# Patient Record
Sex: Female | Born: 1980
Health system: Southern US, Community
[De-identification: ages and names within clinical notes are randomized; demographics above are authoritative.]

## PROBLEM LIST (undated history)

## (undated) DIAGNOSIS — T7840XA Allergy, unspecified, initial encounter: Secondary | ICD-10-CM

## (undated) HISTORY — PX: MYOMECTOMY: SHX85

## (undated) HISTORY — DX: Allergy, unspecified, initial encounter: T78.40XA

---

## 2003-08-17 ENCOUNTER — Emergency Department (HOSPITAL_COMMUNITY): Admission: EM | Admit: 2003-08-17 | Discharge: 2003-08-17 | Payer: Self-pay | Admitting: Emergency Medicine

## 2004-03-26 ENCOUNTER — Emergency Department (HOSPITAL_COMMUNITY): Admission: EM | Admit: 2004-03-26 | Discharge: 2004-03-26 | Payer: Self-pay | Admitting: Emergency Medicine

## 2005-07-27 ENCOUNTER — Ambulatory Visit (HOSPITAL_COMMUNITY): Admission: EM | Admit: 2005-07-27 | Discharge: 2005-07-27 | Payer: Self-pay | Admitting: Emergency Medicine

## 2008-11-04 ENCOUNTER — Encounter: Admission: RE | Admit: 2008-11-04 | Discharge: 2008-11-04 | Payer: Self-pay | Admitting: Family Medicine

## 2011-03-03 NOTE — Op Note (Signed)
NAME:  Hannah Graham, Hannah Graham NO.:  [id number]   MEDICAL RECORD NO.:  [id number]          PATIENT TYPE:  EMS   LOCATION:  ED                           FACILITY:  [facility]   PHYSICIAN:  Dyke Brackett, M.D.    DATE OF BIRTH:  1981-07-21   DATE OF PROCEDURE:  DATE OF DISCHARGE:                                 OPERATIVE REPORT   PREOPERATIVE DIAGNOSIS:  Foreign body impaction, left foot.   POSTOPERATIVE DIAGNOSIS:  Foreign body impaction, left foot.   OPERATION:  1.  Removal of foreign body.  2.  Incision and drainage, left foot.   SURGEON:  Dyke Brackett, M.D.   ASSISTANT:  Lolita Cram, P.A.   DESCRIPTION OF PROCEDURE:  The patient actually had a wine stem imbedded  through her tennis shoe tightly into her foot.  Emergency room evaluation,  which was certainly appropriate.  This was something not to be done over in  the ER due to the fact that it was tightly in the foot as well as the fact  that this would require exploration to make sure there is no residual  foreign body once the foreign object was removed.  After general anesthesia,  there was actually a sharp edge of a wine stem embedded into the patient's  mid foot.  With an unscrewing-type mechanism, this was removed.  It appeared  to be most of the problem relative to the tension on it was through the  rubber of the tennis shoe.  This __________ after this was removed, and  there was no apparent breakage of the stem of the wine glass.  There was no  evidence of any other glass foreign body, and the glass was obvious on the  preoperative x-rays.  The incision was enlarged, probably distally and  proximally, of about 3-4 cm.  There was some mild damage to the intrinsic  musculature to the foot, but there appear to be no neurologic or nerve  injury.  No vascular injury.  Careful probing was noted.  There was no  residual foreign body noted, and it was irrigated out with 3000 cc of  pulsatile lavage.  The incisional  part of the surgery was closed, but a  drain was placed in the small defect created by the wine stem.  A lightly  compressive bulky sterile dressing applied to the foot.  Marcaine without  epinephrine infiltrated into the skin.  Taken to the recovery room in stable  condition.  The tourniquet was released after application of the dressing.  It was not exsanguinated.  Tourniquet was approximately 35 minutes.      Dyke Brackett, M.D.  Electronically Signed     WDC/MEDQ  D:  07/27/2005  T:  07/27/2005  Job:  [id number]

## 2012-01-02 ENCOUNTER — Ambulatory Visit: Payer: Managed Care, Other (non HMO)

## 2012-01-02 ENCOUNTER — Ambulatory Visit (INDEPENDENT_AMBULATORY_CARE_PROVIDER_SITE_OTHER): Payer: Managed Care, Other (non HMO) | Admitting: Family Medicine

## 2012-01-02 VITALS — BP 116/74 | HR 73 | Temp 97.8°F | Resp 16 | Ht 63.0 in | Wt 257.0 lb

## 2012-01-02 DIAGNOSIS — M25539 Pain in unspecified wrist: Secondary | ICD-10-CM

## 2012-01-02 DIAGNOSIS — M778 Other enthesopathies, not elsewhere classified: Secondary | ICD-10-CM

## 2012-01-02 DIAGNOSIS — M25532 Pain in left wrist: Secondary | ICD-10-CM

## 2012-01-02 DIAGNOSIS — M7989 Other specified soft tissue disorders: Secondary | ICD-10-CM

## 2012-01-02 MED ORDER — MELOXICAM 7.5 MG PO TABS: 7.5000 mg | ORAL_TABLET | Freq: Every day | ORAL | Status: AC

## 2012-01-02 NOTE — Patient Instructions (Signed)
Recheck in next 7-10 days.  Elevate arm tonight, return tomorrow if swelling not improving.  Wrist brace as needed and mobic once per day as needed.

## 2012-01-02 NOTE — Progress Notes (Signed)
  Subjective:    Patient ID: Hannah Graham, female    DOB: 08/07/1981, 31 y.o.   MRN: [id number]  HPI Episodic L wrist pain for years, rx brace few years ago.  Usually uses brace every few months - improves usually in a few days.   Current symptoms since yesterday, NKI felt swollen o/n. Doesn't have brace now.  Sore on top of wrist and moving back into arm today and hand feels swollen.    Tx: none R hand dominant. [employer] - computer work  - no change in activity or work.  Review of Systems  Constitutional: Negative for fever and chills.  Musculoskeletal: Positive for joint swelling and arthralgias.  Skin: Negative for rash.       Objective:   Physical Exam  Constitutional: She is oriented to person, place, and time. She appears well-developed and well-nourished.  HENT:  Head: Normocephalic and atraumatic.  Pulmonary/Chest: Effort normal.  Musculoskeletal:       Left wrist: She exhibits decreased range of motion, tenderness and swelling. She exhibits no bony tenderness, no deformity and no laceration.       Arms: Neurological: She is alert and oriented to person, place, and time.  Skin: Skin is warm and dry. No rash noted. No erythema.       Cap refill less than 1 second into L hand and fingertips warm.  No rash.  Psychiatric: She has a normal mood and affect. Her behavior is normal.     [facility] reading (PRIMARY) by  Dr. Neva Seat L wrist: NAD.Marland Kitchen       Assessment & Plan:  Hannah Graham is a 31 y.o. female 1. Pain in left wrist  DG Wrist Complete Left  2. Swelling of hand    3. Wrist tendonitis      Suspected dorsal wrist tendonitis, as NKI, and similar sx's in past except swelling.  Neurovasc intact distally.  Trial of relative rest, wrist splint except ROM BID, and mobic 7.5mg  qd prn.  Elevate tonight - is swelling not improved tomorrow, can rtc for recheck, or to ER sooner if any worsening.  Otherwise recheck next 10 days. Note for work - next 3 days to decrease  use L hand and can wear brace.

## 2012-05-10 ENCOUNTER — Ambulatory Visit (INDEPENDENT_AMBULATORY_CARE_PROVIDER_SITE_OTHER): Payer: Managed Care, Other (non HMO) | Admitting: Internal Medicine

## 2012-05-10 ENCOUNTER — Ambulatory Visit: Payer: Managed Care, Other (non HMO)

## 2012-05-10 VITALS — BP 124/82 | HR 79 | Temp 98.3°F | Resp 16 | Ht 63.5 in | Wt 267.0 lb

## 2012-05-10 DIAGNOSIS — S66912A Strain of unspecified muscle, fascia and tendon at wrist and hand level, left hand, initial encounter: Secondary | ICD-10-CM

## 2012-05-10 DIAGNOSIS — E669 Obesity, unspecified: Secondary | ICD-10-CM

## 2012-05-10 DIAGNOSIS — L259 Unspecified contact dermatitis, unspecified cause: Secondary | ICD-10-CM

## 2012-05-10 DIAGNOSIS — S63509A Unspecified sprain of unspecified wrist, initial encounter: Secondary | ICD-10-CM

## 2012-05-10 DIAGNOSIS — L299 Pruritus, unspecified: Secondary | ICD-10-CM

## 2012-05-10 LAB — POCT GLYCOSYLATED HEMOGLOBIN (HGB A1C): Hemoglobin A1C: 5.2

## 2012-05-10 LAB — GLUCOSE, POCT (MANUAL RESULT ENTRY): POC Glucose: 112 mg/dl — AB (ref 70–99)

## 2012-05-10 MED ORDER — CLOBETASOL PROPIONATE 0.05 % EX CREA: TOPICAL_CREAM | Freq: Two times a day (BID) | CUTANEOUS | Status: AC

## 2012-05-10 MED ORDER — PREDNISONE 10 MG PO TABS: ORAL_TABLET | ORAL | Status: DC

## 2012-05-10 NOTE — Progress Notes (Signed)
  Subjective:    Patient ID: Hannah Graham, female    DOB: 10-14-1981, 31 y.o.   MRN: [id number]  HPI Has itchy faint rash neck upper back, upper inner arms,and chest for 1 week. Minimal rash seen, mostly itchy Also wrestling with boyfriend and wrist was twisted and popped, now painful to extend, a little swollen.   Review of Systems     Objective:   Physical Exam Wrist nmv intact, has painful extention Skin faint rash but mostly scratches seen Obesity, will ck glucose  [facility] reading (PRIMARY) by  Dr.guest lucency seen at navicular, probably normal  Results for orders placed in visit on 05/10/12  GLUCOSE, POCT (MANUAL RESULT ENTRY)      Component Value Range   POC Glucose 112 (*) 70 - 99 mg/dl  POCT GLYCOSYLATED HEMOGLOBIN (HGB A1C)      Component Value Range   Hemoglobin A1C 5.2          Assessment & Plan:  Contact dermatitis Wrist strain  RICE and Splint Prednisone and clobetasol cream and zyrtec

## 2012-09-11 ENCOUNTER — Other Ambulatory Visit (HOSPITAL_COMMUNITY): Payer: Self-pay | Admitting: Obstetrics

## 2012-09-11 DIAGNOSIS — R102 Pelvic and perineal pain: Secondary | ICD-10-CM

## 2012-09-17 ENCOUNTER — Ambulatory Visit (HOSPITAL_COMMUNITY)
Admission: RE | Admit: 2012-09-17 | Discharge: 2012-09-17 | Disposition: A | Discharge status: Home / Self Care | Payer: Managed Care, Other (non HMO) | Source: Ambulatory Visit | Attending: Obstetrics | Admitting: Obstetrics

## 2012-09-17 DIAGNOSIS — N83209 Unspecified ovarian cyst, unspecified side: Secondary | ICD-10-CM | POA: Insufficient documentation

## 2012-09-17 DIAGNOSIS — N949 Unspecified condition associated with female genital organs and menstrual cycle: Secondary | ICD-10-CM | POA: Insufficient documentation

## 2012-09-17 DIAGNOSIS — R102 Pelvic and perineal pain: Secondary | ICD-10-CM

## 2012-09-20 ENCOUNTER — Other Ambulatory Visit (HOSPITAL_COMMUNITY): Payer: Self-pay | Admitting: Obstetrics

## 2012-09-20 DIAGNOSIS — Q524 Other congenital malformations of vagina: Secondary | ICD-10-CM

## 2012-09-20 DIAGNOSIS — Z09 Encounter for follow-up examination after completed treatment for conditions other than malignant neoplasm: Secondary | ICD-10-CM

## 2012-09-24 ENCOUNTER — Ambulatory Visit (HOSPITAL_COMMUNITY)
Admission: RE | Admit: 2012-09-24 | Discharge: 2012-09-24 | Disposition: A | Discharge status: Home / Self Care | Payer: Managed Care, Other (non HMO) | Source: Ambulatory Visit | Attending: Obstetrics | Admitting: Obstetrics

## 2012-09-24 ENCOUNTER — Other Ambulatory Visit (HOSPITAL_COMMUNITY): Payer: Self-pay | Admitting: Obstetrics

## 2012-09-24 DIAGNOSIS — Z09 Encounter for follow-up examination after completed treatment for conditions other than malignant neoplasm: Secondary | ICD-10-CM

## 2012-09-24 DIAGNOSIS — Q524 Other congenital malformations of vagina: Secondary | ICD-10-CM

## 2012-09-24 DIAGNOSIS — Q528 Other specified congenital malformations of female genitalia: Secondary | ICD-10-CM | POA: Insufficient documentation

## 2012-09-24 DIAGNOSIS — N949 Unspecified condition associated with female genital organs and menstrual cycle: Secondary | ICD-10-CM | POA: Insufficient documentation

## 2012-09-24 DIAGNOSIS — N83209 Unspecified ovarian cyst, unspecified side: Secondary | ICD-10-CM | POA: Insufficient documentation

## 2012-09-24 DIAGNOSIS — Q516 Embryonic cyst of cervix: Secondary | ICD-10-CM | POA: Insufficient documentation

## 2012-09-24 DIAGNOSIS — N898 Other specified noninflammatory disorders of vagina: Secondary | ICD-10-CM | POA: Insufficient documentation

## 2013-03-12 ENCOUNTER — Other Ambulatory Visit (HOSPITAL_COMMUNITY): Payer: Self-pay | Admitting: Obstetrics

## 2013-03-12 DIAGNOSIS — N83209 Unspecified ovarian cyst, unspecified side: Secondary | ICD-10-CM

## 2013-03-21 ENCOUNTER — Ambulatory Visit (HOSPITAL_COMMUNITY)
Admission: RE | Admit: 2013-03-21 | Discharge: 2013-03-21 | Disposition: A | Discharge status: Home / Self Care | Payer: Managed Care, Other (non HMO) | Source: Ambulatory Visit | Attending: Obstetrics | Admitting: Obstetrics

## 2013-03-21 DIAGNOSIS — N83209 Unspecified ovarian cyst, unspecified side: Secondary | ICD-10-CM

## 2013-03-28 ENCOUNTER — Ambulatory Visit (HOSPITAL_COMMUNITY): Admission: RE | Admit: 2013-03-28 | Payer: Managed Care, Other (non HMO) | Source: Ambulatory Visit

## 2013-04-04 ENCOUNTER — Ambulatory Visit (HOSPITAL_COMMUNITY)
Admission: RE | Admit: 2013-04-04 | Discharge: 2013-04-04 | Disposition: A | Discharge status: Home / Self Care | Payer: Managed Care, Other (non HMO) | Source: Ambulatory Visit | Attending: Obstetrics | Admitting: Obstetrics

## 2013-04-04 DIAGNOSIS — N898 Other specified noninflammatory disorders of vagina: Secondary | ICD-10-CM | POA: Insufficient documentation

## 2013-04-04 DIAGNOSIS — N854 Malposition of uterus: Secondary | ICD-10-CM | POA: Insufficient documentation

## 2013-04-04 DIAGNOSIS — D259 Leiomyoma of uterus, unspecified: Secondary | ICD-10-CM | POA: Insufficient documentation

## 2013-04-04 DIAGNOSIS — N83209 Unspecified ovarian cyst, unspecified side: Secondary | ICD-10-CM | POA: Insufficient documentation

## 2013-12-31 ENCOUNTER — Ambulatory Visit (INDEPENDENT_AMBULATORY_CARE_PROVIDER_SITE_OTHER): Payer: Managed Care, Other (non HMO) | Admitting: Family Medicine

## 2013-12-31 VITALS — BP 116/72 | HR 66 | Temp 98.8°F | Resp 16 | Ht 63.5 in | Wt 241.0 lb

## 2013-12-31 DIAGNOSIS — J029 Acute pharyngitis, unspecified: Secondary | ICD-10-CM

## 2013-12-31 DIAGNOSIS — Z113 Encounter for screening for infections with a predominantly sexual mode of transmission: Secondary | ICD-10-CM

## 2013-12-31 DIAGNOSIS — N898 Other specified noninflammatory disorders of vagina: Secondary | ICD-10-CM

## 2013-12-31 DIAGNOSIS — J329 Chronic sinusitis, unspecified: Secondary | ICD-10-CM

## 2013-12-31 DIAGNOSIS — R8290 Unspecified abnormal findings in urine: Secondary | ICD-10-CM

## 2013-12-31 DIAGNOSIS — R0982 Postnasal drip: Secondary | ICD-10-CM

## 2013-12-31 DIAGNOSIS — N760 Acute vaginitis: Secondary | ICD-10-CM

## 2013-12-31 DIAGNOSIS — R82998 Other abnormal findings in urine: Secondary | ICD-10-CM

## 2013-12-31 DIAGNOSIS — B9689 Other specified bacterial agents as the cause of diseases classified elsewhere: Secondary | ICD-10-CM

## 2013-12-31 LAB — POCT UA - MICROSCOPIC ONLY
Casts, Ur, LPF, POC: NEGATIVE
Crystals, Ur, HPF, POC: NEGATIVE
Mucus, UA: NEGATIVE
RBC, urine, microscopic: NEGATIVE
Yeast, UA: NEGATIVE

## 2013-12-31 LAB — POCT WET PREP WITH KOH
Clue Cells Wet Prep HPF POC: 100
KOH Prep POC: NEGATIVE
Trichomonas, UA: NEGATIVE
WBC Wet Prep HPF POC: NEGATIVE
Yeast Wet Prep HPF POC: NEGATIVE

## 2013-12-31 LAB — POCT URINALYSIS DIPSTICK
Bilirubin, UA: NEGATIVE
Glucose, UA: NEGATIVE
Ketones, UA: NEGATIVE
Nitrite, UA: NEGATIVE
Protein, UA: NEGATIVE
Spec Grav, UA: 1.025
Urobilinogen, UA: 1
pH, UA: 7

## 2013-12-31 LAB — POCT RAPID STREP A (OFFICE): Rapid Strep A Screen: NEGATIVE

## 2013-12-31 MED ORDER — FLUTICASONE PROPIONATE 50 MCG/ACT NA SUSP: 2.0000 | Freq: Every day | NASAL | Status: DC

## 2013-12-31 MED ORDER — FLUCONAZOLE 150 MG PO TABS: 150.0000 mg | ORAL_TABLET | Freq: Once | ORAL | Status: DC

## 2013-12-31 MED ORDER — METRONIDAZOLE 500 MG PO TABS: 500.0000 mg | ORAL_TABLET | Freq: Two times a day (BID) | ORAL | Status: DC

## 2013-12-31 NOTE — Progress Notes (Signed)
Chief Complaint:  Chief Complaint  Patient presents with  . Sore Throat    x 3 days  . Vaginal Discharge    x 2 weeks     HPI: Hannah Graham is a 33 y.o. female who is here:  1. Has a history of sinus issues, gets drainage and sore throat. Has tried hot tea with out relief. She has had laryngitis. She has had some relief with this since drinking tea. Denies fevers, chill, ear pain or facial pain. No sick contacts. No contact with kids.Has had HA, sometime her uvula swells and has worsening sore throat.   2. Vaginal discaorge several weeks, has taken otc monistat without relief. Sometimes she has BV. She is sexually active, no  Condoms regularly. She has some abd pain but similar to her fibroid. 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 + odor. + clear dc. No recent abx.   She has been taking a streoid pill for the last 1 year from dermatology for her alopecia treatment  History reviewed. No pertinent past medical history. History reviewed. No pertinent past surgical history. History   Social History  . Marital Status: Single    Spouse Name: N/A    Number of Children: N/A  . Years of Education: N/A   Social History Main Topics  . Smoking status: Never Smoker   . Smokeless tobacco: None  . Alcohol Use: None  . Drug Use: None  . Sexual Activity: None   Other Topics Concern  . None   Social History Narrative  . None   Family History  Problem Relation Age of Onset  . Cancer Father     prostate  . Diabetes Father    No Known Allergies Prior to Admission medications   Medication Sig Start Date End Date Taking? Authorizing Provider  predniSONE (DELTASONE) 10 MG tablet Take 6d taper, 60-50-40-30-20-10 po pc 05/10/12  Yes Orma Flaming, MD  terbinafine (LAMISIL) 250 MG tablet Take 250 mg by mouth daily.    Historical Provider, MD     ROS: The patient denies fevers, chills, night sweats, unintentional weight loss, chest pain, palpitations,  wheezing, dyspnea on exertion, nausea, vomiting, abdominal pain,, hematuria, melena, numbness, weakness, or tingling.   All other systems have been reviewed and were otherwise negative with the exception of those mentioned in the HPI and as above.    PHYSICAL EXAM: Filed Vitals:   12/31/13 0958  BP: 116/72  Pulse: 66  Temp: 98.8 F (37.1 C)  Resp: 16   Filed Vitals:   12/31/13 0958  Height: 5' 3.5" (1.613 m)  Weight: 241 lb (109.317 kg)   Body mass index is 42.02 kg/(m^2).  General: Alert, no acute distress HEENT:  Normocephalic, atraumatic, oropharynx patent. EOMI, PERRLA, uvula nl, no exudates, tm nl, no sinus tenderness Cardiovascular:  Regular rate and rhythm, no rubs murmurs or gallops.  No Carotid bruits, radial pulse intact. No pedal edema.  Respiratory: Clear to auscultation bilaterally.  No wheezes, rales, or rhonchi.  No cyanosis, no use of accessory musculature GI: No organomegaly, abdomen is soft and non-tender, positive bowel sounds.  No masses. Skin: No rashes. Neurologic: Facial musculature symmetric. Psychiatric: Patient is appropriate throughout our interaction. Lymphatic: No cervical lymphadenopathy Musculoskeletal: Gait intact. GU-clear dc, no masses, + odor, cervix nl   LABS: Results for orders placed in visit on 12/31/13  POCT UA - MICROSCOPIC ONLY      Result Value  Ref Range   WBC, Ur, HPF, POC 1-2     RBC, urine, microscopic neg     Bacteria, U Microscopic 3+     Mucus, UA neg     Epithelial cells, urine per micros 3-8     Crystals, Ur, HPF, POC neg     Casts, Ur, LPF, POC neg     Yeast, UA neg    POCT URINALYSIS DIPSTICK      Result Value Ref Range   Color, UA yellow     Clarity, UA sl cloudy     Glucose, UA neg     Bilirubin, UA neg     Ketones, UA neg     Spec Grav, UA 1.025     Blood, UA small     pH, UA 7.0     Protein, UA neg     Urobilinogen, UA 1.0     Nitrite, UA neg     Leukocytes, UA Trace    POCT WET PREP WITH KOH       Result Value Ref Range   Trichomonas, UA Negative     Clue Cells Wet Prep HPF POC 100%     Epithelial Wet Prep HPF POC 3-8     Yeast Wet Prep HPF POC neg     Bacteria Wet Prep HPF POC 3+     RBC Wet Prep HPF POC 3-5     WBC Wet Prep HPF POC neg     KOH Prep POC Negative    POCT RAPID STREP A (OFFICE)      Result Value Ref Range   Rapid Strep A Screen Negative  Negative     EKG/XRAY:   Primary read interpreted by Dr. Marin Comment at [facility].   ASSESSMENT/PLAN: Encounter Diagnoses  Name Primary?  . Acute pharyngitis   . Screening for STD (sexually transmitted disease)   . Vaginal discharge   . Bacterial vaginosis Yes  . Post-nasal drainage   . Abnormal urinalysis    Flonase otc cepachol Rx flagyl and diflucan Labs pending for STD screening F/u prn  Gross sideeffects, risk and benefits, and alternatives of medications d/w patient. Patient is aware that all medications have potential sideeffects and we are unable to predict every sideeffect or drug-drug interaction that may occur.  Najiyah Paris, [place], DO 12/31/2013 12:28 PM

## 2013-12-31 NOTE — Patient Instructions (Signed)

## 2014-01-01 LAB — URINE CULTURE
Colony Count: NO GROWTH
Organism ID, Bacteria: NO GROWTH

## 2014-01-01 LAB — GC/CHLAMYDIA PROBE AMP
CT Probe RNA: NEGATIVE
GC Probe RNA: NEGATIVE

## 2015-06-16 IMAGING — US US PELVIS COMPLETE
1 series · 13 of 25 positions shown · non-contrast
Comparison: [DATE] and 09/24/2012

CLINICAL DATA: Follow-up ovarian cyst.  LMP 03/14/2013



[Series 1: us pelvis complete · 13 of 91 slices shown]
[im 1/91]
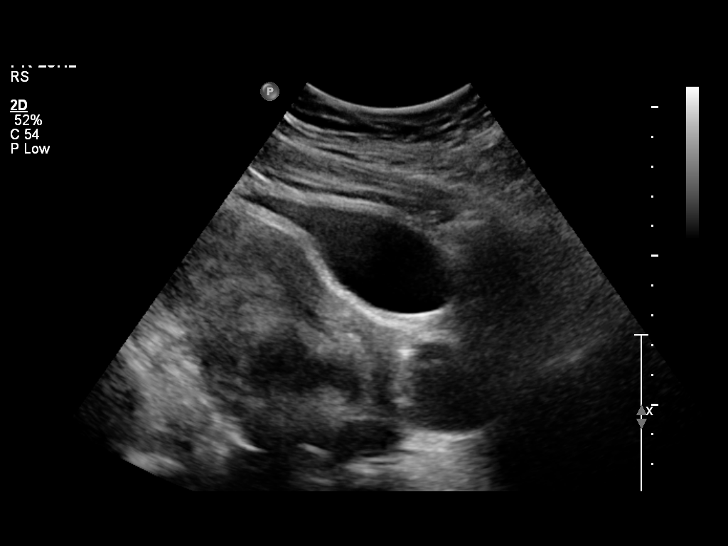
[im 8/91]
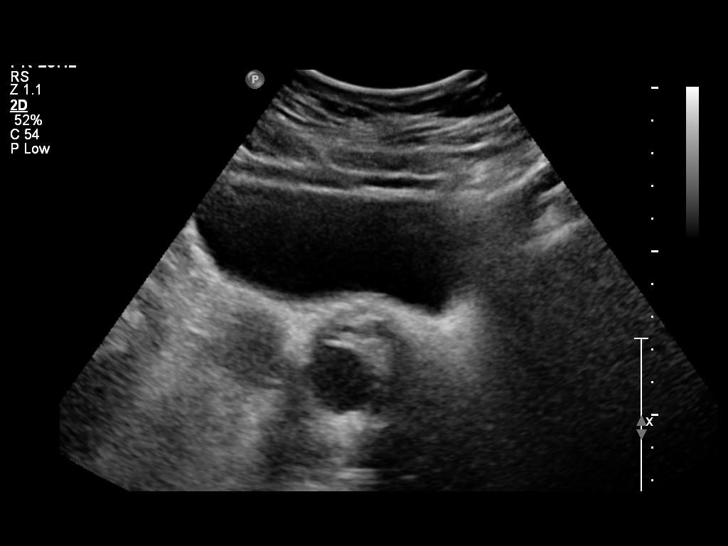
[im 16/91]
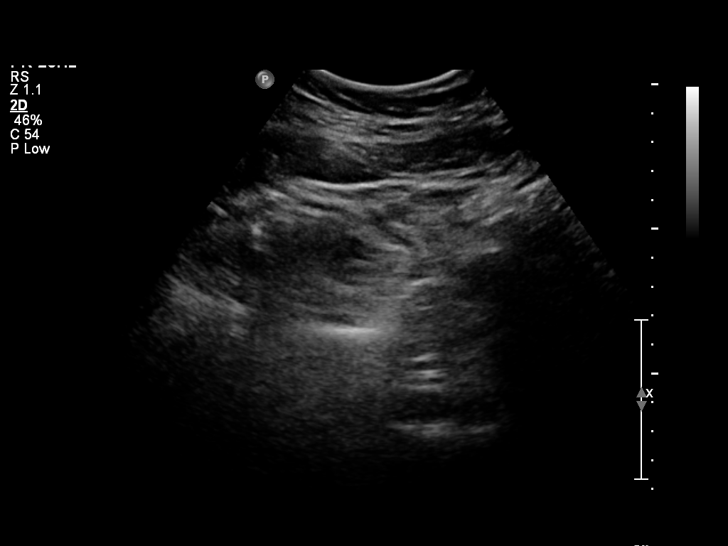
[im 23/91]
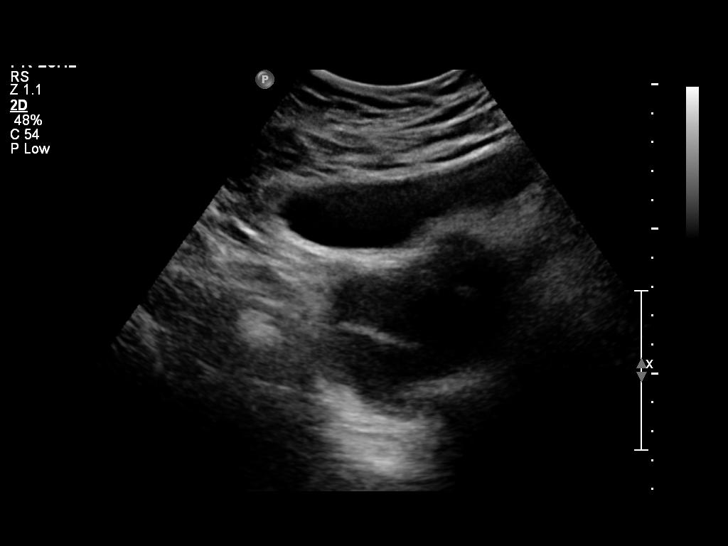
[im 31/91]
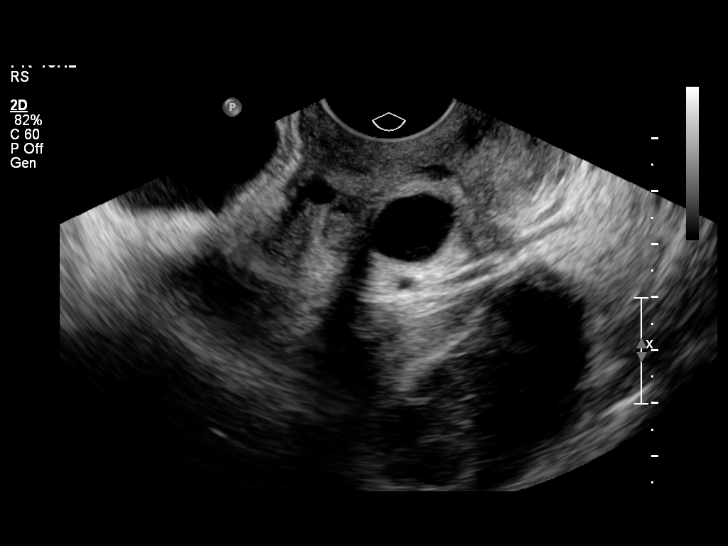
[im 38/91]
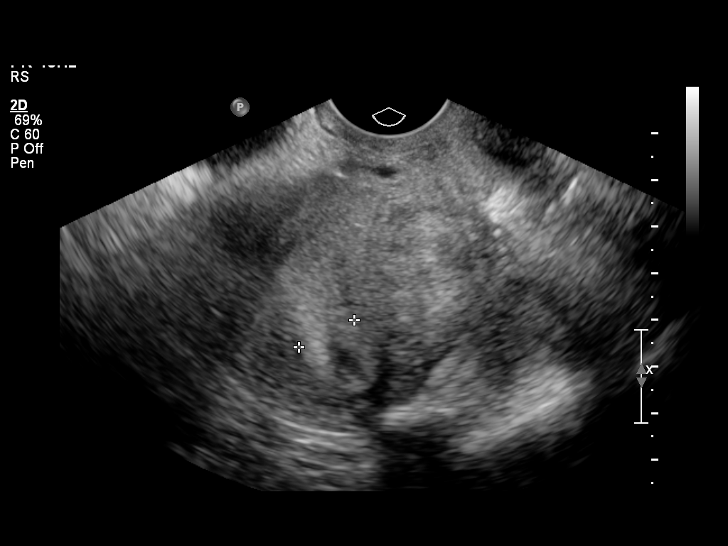
[im 46/91]
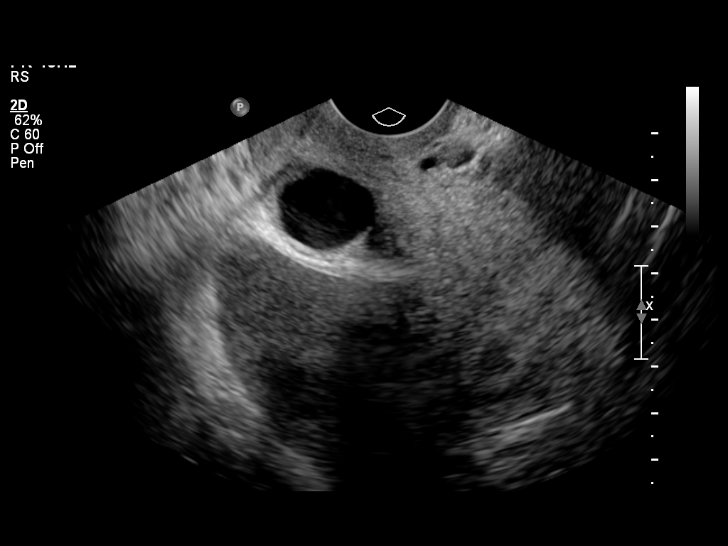
[im 53/91]
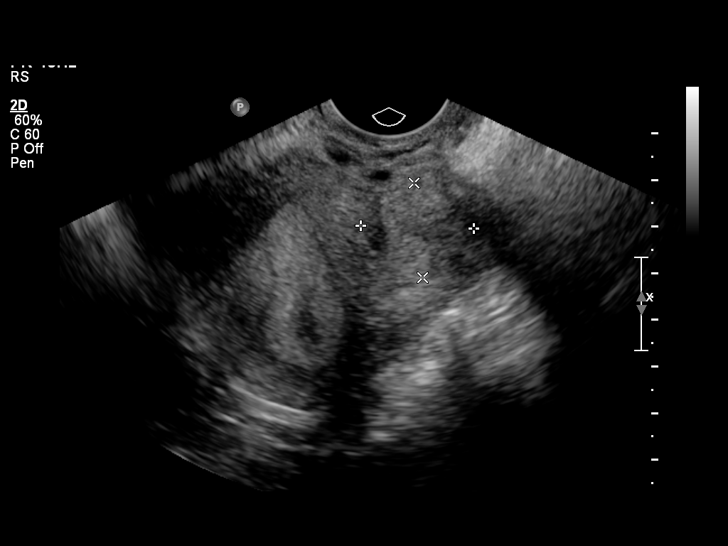
[im 61/91]
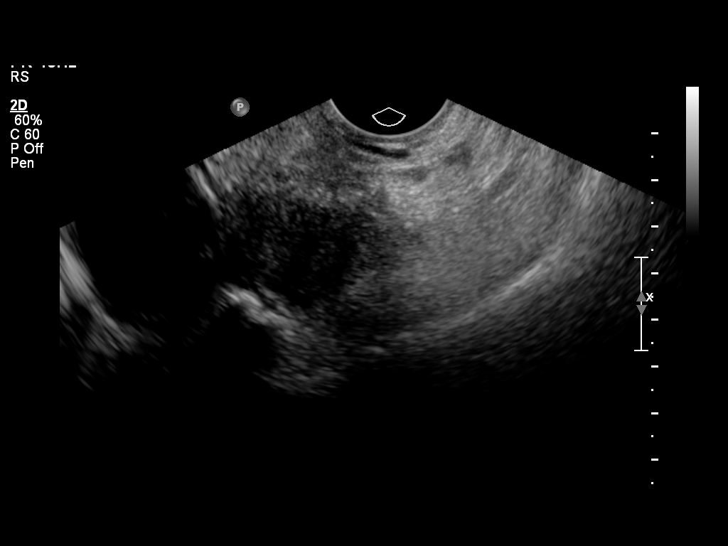
[im 68/91]
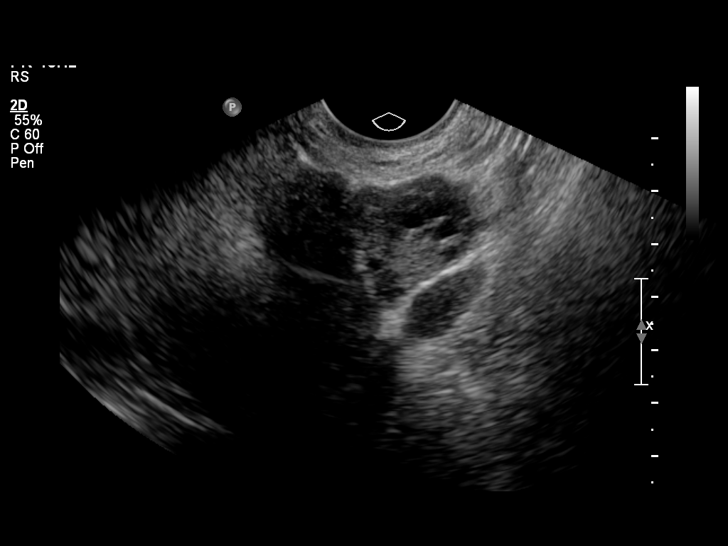
[im 76/91]
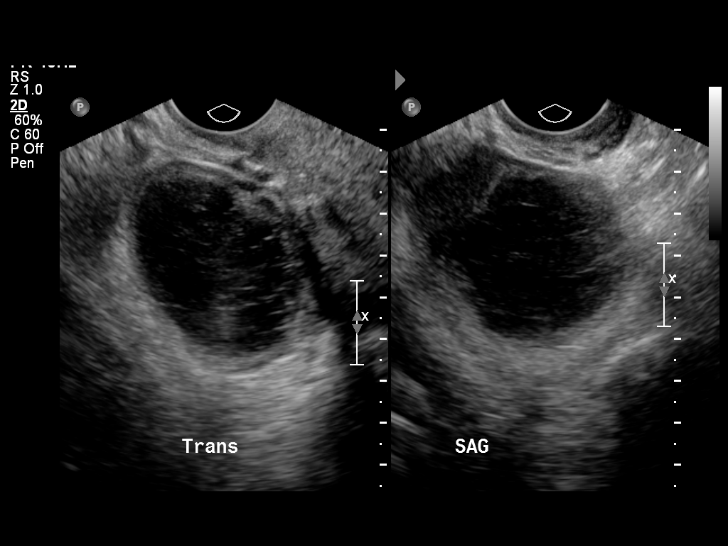
[im 83/91]
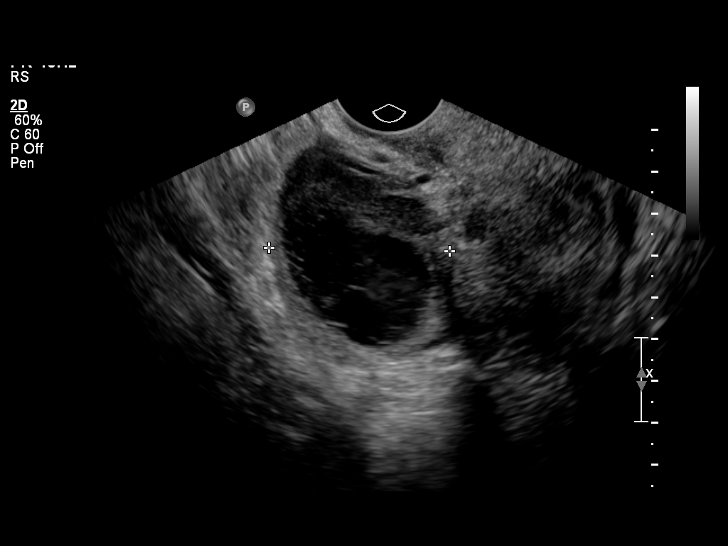
[im 91/91]
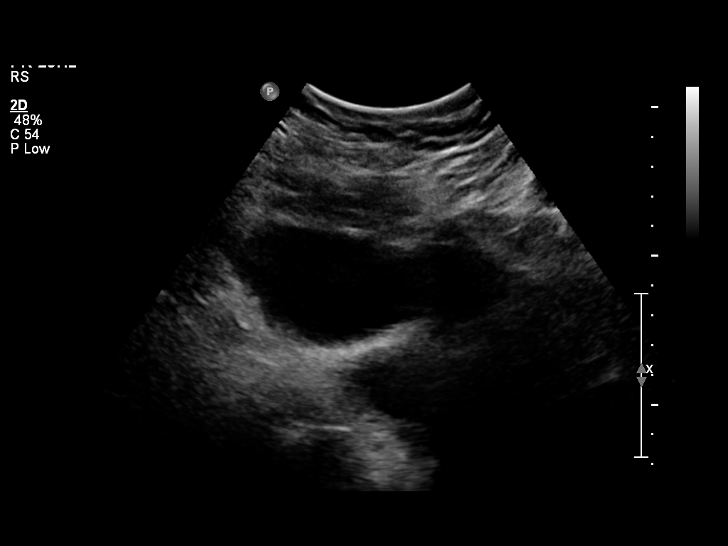

[13 of 25 positions shown; findings below may reference images not displayed]

FINDINGS: Uterus: Is anteverted and retroflexed and demonstrates a sagittal
length of 9.8 cm, depth of 4.8 cm and width of 4.7 cm.  A focal
fibroid is identified associated with the posterior left lateral
lower uterine segment measuring 2.4 x 2.8 x 2.7 cm with a partial
subserosal component

Endometrium: Is homogeneously echogenic with a width of 13 mm.
This would correlate with a presecretory endometrial stripe and
correspond with the provided LMP of 03/14/2013

Right ovary:  Measures 6.1 x 5.3 x 4.3 cm and contains a complex
cystic lesion measuring 3.5 x 3.5 x 3.7 cm which demonstrates
internal thin fibrinous changes and is avascular. The appearance is
most compatible with a hemorrhagic cyst.  A smaller complex cyst is
identified within the right ovary measuring 2.8 x 2.1 x 3.5 cm and
contains diffuse low level echoes.  This was not present on the
prior exams mitigating against this representing an endometrioma.
This more likely represents a second small hemorrhagic cyst.

Left ovary: Measures 2.9 x 2.1 x 2.3 cm and has a normal
appearance.

Other findings: In the left adnexa adjacent to the left ovary is a
small thin-walled cystic structure measuring 2.1 x 1.0 by 0.8 cm.
This could represent either a paraovarian cyst or hydrosalpinx.
This was not clearly visualized on the prior exams but has benign
features.  No separate adnexal fluid is seen.

Again noted in the upper vaginal vault is a complex cystic
structure measuring 2.2 x 2.7 x 4.1 cm.  This is thin-walled and
contains diffuse low level echoes and is most compatible with a
Gartner duct cyst.  This is stable in comparison with the previous
exam
IMPRESSION: Fibroid with sizes location as noted above.

Normal presecretory endometrium.

Two complex right ovarian cysts have appearance is most compatible
with hemorrhagic cysts.  These can be reevaluated in in the
immediate postsecretory phase of the cycle following the next
complete cycle to assess for resolution/evolution as would be
expected with a hemorrhagic cyst.

Left adnexal cystic lesion may represent either a hydrosalpinx or
paraovarian cyst.  This has benign features.

Stable vaginal cyst..

## 2015-06-28 ENCOUNTER — Ambulatory Visit (INDEPENDENT_AMBULATORY_CARE_PROVIDER_SITE_OTHER): Payer: BLUE CROSS/BLUE SHIELD | Admitting: Family Medicine

## 2015-06-28 ENCOUNTER — Ambulatory Visit (INDEPENDENT_AMBULATORY_CARE_PROVIDER_SITE_OTHER): Payer: BLUE CROSS/BLUE SHIELD

## 2015-06-28 VITALS — BP 108/68 | HR 62 | Temp 98.1°F | Resp 12 | Ht 64.0 in | Wt 277.0 lb

## 2015-06-28 DIAGNOSIS — S61019A Laceration without foreign body of unspecified thumb without damage to nail, initial encounter: Secondary | ICD-10-CM

## 2015-06-28 DIAGNOSIS — M25532 Pain in left wrist: Secondary | ICD-10-CM | POA: Diagnosis not present

## 2015-06-28 DIAGNOSIS — S61012A Laceration without foreign body of left thumb without damage to nail, initial encounter: Secondary | ICD-10-CM | POA: Diagnosis not present

## 2015-06-28 DIAGNOSIS — Z23 Encounter for immunization: Secondary | ICD-10-CM | POA: Diagnosis not present

## 2015-06-28 MED ORDER — AMOXICILLIN-POT CLAVULANATE 875-125 MG PO TABS: 1.0000 | ORAL_TABLET | Freq: Two times a day (BID) | ORAL | Status: DC

## 2015-06-28 NOTE — Patient Instructions (Addendum)
Laceration Care, Adult A laceration is a cut or lesion that goes through all layers of the skin and into the tissue just beneath the skin. TREATMENT  Some lacerations may not require closure. Some lacerations may not be able to be closed due to an increased risk of infection. It is important to see your caregiver as soon as possible after an injury to minimize the risk of infection and maximize the opportunity for successful closure. If closure is appropriate, pain medicines may be given, if needed. The wound will be cleaned to help prevent infection. Your caregiver will use stitches (sutures), staples, wound glue (adhesive), or skin adhesive strips to repair the laceration. These tools bring the skin edges together to allow for faster healing and a better cosmetic outcome. However, all wounds will heal with a scar. Once the wound has healed, scarring can be minimized by covering the wound with sunscreen during the day for 1 full year. HOME CARE INSTRUCTIONS  For sutures or staples:  Keep the wound clean and dry.  If you were given a bandage (dressing), you should change it at least once a day. Also, change the dressing if it becomes wet or dirty, or as directed by your caregiver.  Wash the wound with soap and water 2 times a day. Rinse the wound off with water to remove all soap. Pat the wound dry with a clean towel.  After cleaning, apply a thin layer of the antibiotic ointment as recommended by your caregiver. This will help prevent infection and keep the dressing from sticking.  You may shower as usual after the first 24 hours. Do not soak the wound in water until the sutures are removed.  Only take over-the-counter or prescription medicines for pain, discomfort, or fever as directed by your caregiver.  Get your sutures or staples removed as directed by your caregiver. For skin adhesive strips:  Keep the wound clean and dry.  Do not get the skin adhesive strips wet. You may bathe  carefully, using caution to keep the wound dry.  If the wound gets wet, pat it dry with a clean towel.  Skin adhesive strips will fall off on their own. You may trim the strips as the wound heals. Do not remove skin adhesive strips that are still stuck to the wound. They will fall off in time. For wound adhesive:  You may briefly wet your wound in the shower or bath. Do not soak or scrub the wound. Do not swim. Avoid periods of heavy perspiration until the skin adhesive has fallen off on its own. After showering or bathing, gently pat the wound dry with a clean towel.  Do not apply liquid medicine, cream medicine, or ointment medicine to your wound while the skin adhesive is in place. This may loosen the film before your wound is healed.  If a dressing is placed over the wound, be careful not to apply tape directly over the skin adhesive. This may cause the adhesive to be pulled off before the wound is healed.  Avoid prolonged exposure to sunlight or tanning lamps while the skin adhesive is in place. Exposure to ultraviolet light in the first year will darken the scar.  The skin adhesive will usually remain in place for 5 to 10 days, then naturally fall off the skin. Do not pick at the adhesive film. You may need a tetanus shot if:  You cannot remember when you had your last tetanus shot.  You have never had a tetanus  shot. If you get a tetanus shot, your arm may swell, get red, and feel warm to the touch. This is common and not a problem. If you need a tetanus shot and you choose not to have one, there is a rare chance of getting tetanus. Sickness from tetanus can be serious. SEEK MEDICAL CARE IF:   You have redness, swelling, or increasing pain in the wound.  You see a red line that goes away from the wound.  You have yellowish-white fluid (pus) coming from the wound.  You have a fever.  You notice a bad smell coming from the wound or dressing.  Your wound breaks open before or  after sutures have been removed.  You notice something coming out of the wound such as wood or glass.  Your wound is on your hand or foot and you cannot move a finger or toe. SEEK IMMEDIATE MEDICAL CARE IF:   Your pain is not controlled with prescribed medicine.  You have severe swelling around the wound causing pain and numbness or a change in color in your arm, hand, leg, or foot.  Your wound splits open and starts bleeding.  You have worsening numbness, weakness, or loss of function of any joint around or beyond the wound.  You develop painful lumps near the wound or on the skin anywhere on your body. MAKE SURE YOU:   Understand these instructions.  Will watch your condition.  Will get help right away if you are not doing well or get worse. Document Released: 10/02/2005 Document Revised: 12/25/2011 Document Reviewed: 03/28/2011 [facility] Patient Information 2015 [place], [place]. This information is not intended to replace advice given to you by your health care provider. Make sure you discuss any questions you have with your health care provider.  Wrist Pain Wrist injuries are frequent in adults and children. A sprain is an injury to the ligaments that hold your bones together. A strain is an injury to muscle or muscle cord-like structures (tendons) from stretching or pulling. Generally, when wrists are moderately tender to touch following a fall or injury, a break in the bone (fracture) may be present. Most wrist sprains or strains are better in 3 to 5 days, but complete healing may take several weeks. HOME CARE INSTRUCTIONS   Put ice on the injured area.  Put ice in a plastic bag.  Place a towel between your skin and the bag.  Leave the ice on for 15-20 minutes, 3-4 times a day, for the first 2 days, or as directed by your health care provider.  Keep your arm raised above the level of your heart whenever possible to reduce swelling and pain.  Rest the injured area for at  least 48 hours or as directed by your health care provider.  If a splint or elastic bandage has been applied, use it for as long as directed by your health care provider or until seen by a health care provider for a follow-up exam.  Only take over-the-counter or prescription medicines for pain, discomfort, or fever as directed by your health care provider.  Keep all follow-up appointments. You may need to follow up with a specialist or have follow-up X-rays. Improvement in pain level is not a guarantee that you did not fracture a bone in your wrist. The only way to determine whether or not you have a broken bone is by X-ray. SEEK IMMEDIATE MEDICAL CARE IF:   Your fingers are swollen, very red, white, or cold and blue.  Your fingers are numb or tingling.  You have increasing pain.  You have difficulty moving your fingers. MAKE SURE YOU:   Understand these instructions.  Will watch your condition.  Will get help right away if you are not doing well or get worse. Document Released: 07/12/2005 Document Revised: 10/07/2013 Document Reviewed: 11/23/2010 [facility] Patient Information 2015 [place], [place]. This information is not intended to replace advice given to you by your health care provider. Make sure you discuss any questions you have with your health care provider.

## 2015-06-28 NOTE — Progress Notes (Signed)
    MRN: [id number] DOB: 1981/05/03  Subjective:   Hannah Graham is a 34 y.o. female presenting for chief complaint of Laceration and Immunizations  Laceration - reports left thumb laceration last night while trying to open a bottle of wine. Patient was using a large metal cooking fork. Her hand slipped and she ended up jamming the fork into her left thumb. Patient felt immediate pain and had progressive swelling throughout the night. She used Aleve which helped with pain and swelling. Admits numbness and tingling throughout her entire arm and since her thumb laceration. She has kept her hand clean and protected. Patient admits that her last TDAP was more than 10 years ago. Denies fever, redness, drainage pus or bleeding.   Wrist pain - reports a history of left wrist pain. Had x-rays done in 2013 which were normal. 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 She would like to have her wrist x-ray repeated today. Admits that she has occasional pain in her wrist with intermittent clicking, occasional tingling. Denies swelling, no redness, no bony deformity, no trauma.  Denies any other aggravating or relieving factors, no other questions or concerns.  Hannah Graham currently has no medications in their medication list. Also has No Known Allergies.  Hannah Graham  has a past medical history of Allergy. Also  has no past surgical history on file.  Objective:   Vitals: BP 108/68 mmHg  Pulse 62  Temp(Src) 98.1 F (36.7 C) (Oral)  Resp 12  Ht 5\' 4"  (1.626 m)  Wt 277 lb (125.646 kg)  BMI 47.52 kg/m2  SpO2 98%  Physical Exam  Constitutional: She is oriented to person, place, and time. She appears well-developed and well-nourished.  Cardiovascular: Normal rate.   Pulmonary/Chest: Effort normal.  Musculoskeletal:       Left wrist: She exhibits normal range of motion, no tenderness, no bony tenderness, no swelling, no effusion, no crepitus, no deformity and no laceration.       Left hand: She exhibits tenderness  (at DIP), laceration and swelling (trace). She exhibits normal range of motion, no bony tenderness and no deformity. Normal strength noted.       Hands: Negative Tinnel and Phalens tests for left wrist.  Neurological: She is alert and oriented to person, place, and time.  Skin: Skin is warm and dry. No rash noted. No erythema. No pallor.   Left thumb - no evidence of fracture. Left wrist - normal.  Assessment and Plan :   1. Laceration of thumb, unspecified laterality, initial encounter - X-ray and physical exam findings reassuring. Will leave wound open given that it is a puncture wound. Provided patient with a prescription for Augmentin in case infection develops as discussed in clinic. Patient is to let me know if she starts antibiotic course, will discuss symptoms at that time and need for follow up.  2. Left wrist pain - Stable, reassured patient. Recommended over-the-counter NSAID, wear wrist brace. Advised that she communicate with her employer about ergonomics and appropriate for her daily use of computer.  3. Need for Tdap vaccination - Tdap vaccine greater than or equal to 7yo IM   [name], PA-C [facility] [phone] 06/28/2015 9:14 AM

## 2016-06-03 ENCOUNTER — Other Ambulatory Visit: Payer: Self-pay

## 2017-08-01 ENCOUNTER — Encounter (HOSPITAL_BASED_OUTPATIENT_CLINIC_OR_DEPARTMENT_OTHER): Payer: Self-pay

## 2017-08-01 ENCOUNTER — Emergency Department (HOSPITAL_BASED_OUTPATIENT_CLINIC_OR_DEPARTMENT_OTHER)
Admission: EM | Admit: 2017-08-01 | Discharge: 2017-08-01 | Disposition: A | Discharge status: Home / Self Care | Payer: BLUE CROSS/BLUE SHIELD | Attending: Emergency Medicine | Admitting: Emergency Medicine

## 2017-08-01 DIAGNOSIS — M549 Dorsalgia, unspecified: Secondary | ICD-10-CM | POA: Diagnosis not present

## 2017-08-01 DIAGNOSIS — R1013 Epigastric pain: Secondary | ICD-10-CM | POA: Diagnosis not present

## 2017-08-01 LAB — COMPREHENSIVE METABOLIC PANEL
ALT: 52 U/L (ref 14–54)
AST: 98 U/L — AB (ref 15–41)
Albumin: 3.3 g/dL — ABNORMAL LOW (ref 3.5–5.0)
Alkaline Phosphatase: 71 U/L (ref 38–126)
Anion gap: 6 (ref 5–15)
BILIRUBIN TOTAL: 0.4 mg/dL (ref 0.3–1.2)
BUN: 10 mg/dL (ref 6–20)
CALCIUM: 8.6 mg/dL — AB (ref 8.9–10.3)
CHLORIDE: 104 mmol/L (ref 101–111)
CO2: 25 mmol/L (ref 22–32)
CREATININE: 0.73 mg/dL (ref 0.44–1.00)
Glucose, Bld: 114 mg/dL — ABNORMAL HIGH (ref 65–99)
Potassium: 4.4 mmol/L (ref 3.5–5.1)
Sodium: 135 mmol/L (ref 135–145)
TOTAL PROTEIN: 7.2 g/dL (ref 6.5–8.1)

## 2017-08-01 LAB — CBC WITH DIFFERENTIAL/PLATELET
Basophils Absolute: 0 10*3/uL (ref 0.0–0.1)
Basophils Relative: 0 %
EOS PCT: 2 %
Eosinophils Absolute: 0.1 10*3/uL (ref 0.0–0.7)
HEMATOCRIT: 34.2 % — AB (ref 36.0–46.0)
Hemoglobin: 11.4 g/dL — ABNORMAL LOW (ref 12.0–15.0)
LYMPHS ABS: 2.4 10*3/uL (ref 0.7–4.0)
LYMPHS PCT: 40 %
MCH: 28.9 pg (ref 26.0–34.0)
MCHC: 33.3 g/dL (ref 30.0–36.0)
MCV: 86.6 fL (ref 78.0–100.0)
MONO ABS: 0.4 10*3/uL (ref 0.1–1.0)
Monocytes Relative: 6 %
NEUTROS ABS: 3.1 10*3/uL (ref 1.7–7.7)
Neutrophils Relative %: 52 %
PLATELETS: 276 10*3/uL (ref 150–400)
RBC: 3.95 MIL/uL (ref 3.87–5.11)
RDW: 14.1 % (ref 11.5–15.5)
WBC: 6 10*3/uL (ref 4.0–10.5)

## 2017-08-01 LAB — URINALYSIS, ROUTINE W REFLEX MICROSCOPIC
BILIRUBIN URINE: NEGATIVE
Glucose, UA: NEGATIVE mg/dL
HGB URINE DIPSTICK: NEGATIVE
Ketones, ur: NEGATIVE mg/dL
Leukocytes, UA: NEGATIVE
Nitrite: NEGATIVE
PH: 8.5 — AB (ref 5.0–8.0)
Protein, ur: NEGATIVE mg/dL
SPECIFIC GRAVITY, URINE: 1.015 (ref 1.005–1.030)

## 2017-08-01 LAB — LIPASE, BLOOD: LIPASE: 33 U/L (ref 11–51)

## 2017-08-01 LAB — PREGNANCY, URINE: Preg Test, Ur: NEGATIVE

## 2017-08-01 MED ORDER — FAMOTIDINE 20 MG PO TABS: 20.0000 mg | ORAL_TABLET | Freq: Two times a day (BID) | ORAL | 0 refills | Status: AC

## 2017-08-01 MED ORDER — GI COCKTAIL ~~LOC~~
30.0000 mL | Freq: Once | ORAL | Status: AC
  Administered 2017-08-01: 30 mL via ORAL
  Filled 2017-08-01: qty 30

## 2017-08-01 MED ORDER — FAMOTIDINE 20 MG PO TABS: 20.0000 mg | ORAL_TABLET | Freq: Two times a day (BID) | ORAL | 0 refills | Status: DC

## 2017-08-01 MED FILL — FAMOTIDINE 20 MG TABLET: 20 | 5 days supply | Qty: 10 | Fill #0

## 2017-08-01 NOTE — ED Provider Notes (Signed)
[facility] EMERGENCY DEPARTMENT Provider Note   CSN: [id number] Arrival date & time: 08/01/17  1224     History   Chief Complaint Chief Complaint  Patient presents with  . Abdominal Pain    HPI Hannah Graham is a 36 y.o. female.  Patient with history of GERD, on the current treatment, with no past surgical history, who was in her normal state of health until approximately 30 minutes prior to arrival, when she developed acute onset of sharp, epigastric and left upper quadrant pain with radiation to her back. Patient broke out into a sweat. Symptoms have gradually improved but are not resolved. She denies any lightheadedness or syncope. She has not had any chest pain or shortness of breath. No nausea, vomiting, diarrhea, constipation. No urinary symptoms, vaginal bleeding or discharge. Patient was recently prescribed amoxicillin for a strep throat infection. Her last dose is tomorrow. She denies heavy NSAID or alcohol use.      Past Medical History:  Diagnosis Date  . Allergy     Patient Active Problem List   Diagnosis Date Noted  . Left wrist pain 06/28/2015  . Laceration of thumb 06/28/2015    History reviewed. No pertinent surgical history.  OB History    No data available       Home Medications    Prior to Admission medications   Not on File    Family History Family History  Problem Relation Age of Onset  . Cancer Father        prostate  . Diabetes Father   . Hyperlipidemia Brother   . Diabetes Paternal Grandmother   . Hearing loss Paternal Grandmother     Social History Social History  Substance Use Topics  . Smoking status: Never Smoker  . Smokeless tobacco: Never Used  . Alcohol use Yes     Comment: occ     Allergies   Patient has no known allergies.   Review of Systems Review of Systems  Constitutional: Negative for fever.  HENT: Negative for rhinorrhea and sore throat.   Eyes: Negative for redness.  Respiratory:  Negative for cough.   Cardiovascular: Negative for chest pain.  Gastrointestinal: Positive for abdominal pain. Negative for diarrhea, nausea and vomiting.  Genitourinary: Negative for dysuria.  Musculoskeletal: Positive for back pain. Negative for myalgias.  Skin: Negative for rash.  Neurological: Negative for headaches.     Physical Exam Updated Vital Signs BP 121/71 (BP Location: Left Arm)   Pulse 79   Temp 97.8 F (36.6 C) (Oral)   Resp 18   Ht 5\' 3"  (1.6 m)   Wt 117 kg (257 lb 15 oz)   LMP 07/16/2017   SpO2 100%   BMI 45.69 kg/m   Physical Exam  Constitutional: She appears well-developed and well-nourished.  HENT:  Head: Normocephalic and atraumatic.  Eyes: Conjunctivae are normal. Right eye exhibits no discharge. Left eye exhibits no discharge.  Neck: Normal range of motion. Neck supple.  Cardiovascular: Normal rate, regular rhythm and normal heart sounds.   No murmur heard. Pulmonary/Chest: Effort normal and breath sounds normal. No respiratory distress. She has no wheezes. She has no rales.  Abdominal: Soft. Bowel sounds are normal. She exhibits no distension. There is tenderness. There is no rebound and no guarding.  Minimal epigastric tenderness to palpation on exam. Patient appears comfortable.  Neurological: She is alert.  Skin: Skin is warm and dry.  Psychiatric: She has a normal mood and affect.  Nursing note and  vitals reviewed.    ED Treatments / Results  Labs (all labs ordered are listed, but only abnormal results are displayed) Labs Reviewed  URINALYSIS, ROUTINE W REFLEX MICROSCOPIC - Abnormal; Notable for the following:       Result Value   pH 8.5 (*)    All other components within normal limits  CBC WITH DIFFERENTIAL/PLATELET - Abnormal; Notable for the following:    Hemoglobin 11.4 (*)    HCT 34.2 (*)    All other components within normal limits  COMPREHENSIVE METABOLIC PANEL - Abnormal; Notable for the following:    Glucose, Bld 114 (*)     Calcium 8.6 (*)    Albumin 3.3 (*)    AST 98 (*)    All other components within normal limits  PREGNANCY, URINE  LIPASE, BLOOD    Procedures Procedures (including critical care time)  Medications Ordered in ED Medications  gi cocktail (Maalox,Lidocaine,Donnatal) (30 mLs Oral Given 08/01/17 1325)     Initial Impression / Assessment and Plan / ED Course  I have reviewed the triage vital signs and the nursing notes.  Pertinent labs & imaging results that were available during my care of the patient were reviewed by me and considered in my medical decision making (see chart for details).     Patient seen and examined. Work-up initiated. Medications ordered.   Patient is well-appearing and appears comfortable. Overall her symptoms are much improved. Unclear etiology at this point. I have very low suspicion for ACS, aortic dissection. Will check labs to screen for any pancreatitis or transaminitis. These are negative, will likely treat supportively without advanced imaging at this time given reassuring exam.  Vital signs reviewed and are as follows: BP 121/71 (BP Location: Left Arm)   Pulse 79   Temp 97.8 F (36.6 C) (Oral)   Resp 18   Ht 5\' 3"  (1.6 m)   Wt 117 kg (257 lb 15 oz)   LMP 07/16/2017   SpO2 100%   BMI 45.69 kg/m   2:34 PM patient improved with GI cocktail. Symptoms are now completely resolved.  Will discharge to home with Pepcid for symptomatic relief. Patient clinically improved with her sore throat, she will not finish her course of amoxicillin.  The patient was urged to return to the Emergency Department immediately with worsening of current symptoms, worsening abdominal pain, persistent vomiting, blood noted in stools, fever, or any other concerns. The patient verbalized understanding.    Final Clinical Impressions(s) / ED Diagnoses   Final diagnoses:  Epigastric pain   Patient with acute epigastric pain prior to arrival, now completely resolved. Workup  is reassuring. At time of discharge abdomen is soft and nontender. Question esophageal spasm, gastritis. Patient will be started on H2 blocker and bland diet for the next several days. Return instructions as above.  New Prescriptions New Prescriptions   No medications on file     Carlisle Cater, Hershal Coria 08/01/17 [place], DO 08/01/17 1447

## 2017-08-01 NOTE — Discharge Instructions (Signed)
Please read and follow all provided instructions.  Your diagnoses today include:  1. Epigastric pain     Tests performed today include:  Blood counts and electrolytes - shows mild anemia  Blood tests to check liver and kidney function  Blood tests to check pancreas function  Urine test to look for infection and pregnancy (in women)  Vital signs. See below for your results today.   Medications prescribed:   Pepcid (famotidine) - antihistamine  You can find this medication over-the-counter.   DO NOT exceed:   20mg  Pepcid every 12 hours  Take any prescribed medications only as directed.  Home care instructions:   Follow any educational materials contained in this packet.  Follow-up instructions: Please follow-up with your primary care provider in the next 3 days for further evaluation of your symptoms if not improved.    Return instructions:  SEEK IMMEDIATE MEDICAL ATTENTION IF:  The pain does not go away or becomes severe   A temperature above 101F develops   Repeated vomiting occurs (multiple episodes)   The pain becomes localized to portions of the abdomen. The right side could possibly be appendicitis. In an adult, the left lower portion of the abdomen could be colitis or diverticulitis.   Blood is being passed in stools or vomit (bright red or black tarry stools)   You develop chest pain, difficulty breathing, dizziness or fainting, or become confused, poorly responsive, or inconsolable (young children)  If you have any other emergent concerns regarding your health  Additional Information: Abdominal (belly) pain can be caused by many things. Your caregiver performed an examination and possibly ordered blood/urine tests and imaging (CT scan, x-rays, ultrasound). Many cases can be observed and treated at home after initial evaluation in the emergency department. Even though you are being discharged home, abdominal pain can be unpredictable. Therefore, you need a  repeated exam if your pain does not resolve, returns, or worsens. Most patients with abdominal pain don't have to be admitted to the hospital or have surgery, but serious problems like appendicitis and gallbladder attacks can start out as nonspecific pain. Many abdominal conditions cannot be diagnosed in one visit, so follow-up evaluations are very important.  Your vital signs today were: BP 121/71 (BP Location: Left Arm)    Pulse 79    Temp 97.8 F (36.6 C) (Oral)    Resp 18    Ht 5\' 3"  (1.6 m)    Wt 117 kg (257 lb 15 oz)    LMP 07/16/2017    SpO2 100%    BMI 45.69 kg/m  If your blood pressure (bp) was elevated above 135/85 this visit, please have this repeated by your doctor within one month. --------------

## 2017-08-01 NOTE — ED Triage Notes (Addendum)
C/o abd pain, back pain and "broke out ina sweat" x 30 min-pt is on amoxil for strep throat x 8 days-NAD-steady gait

## 2017-09-08 IMAGING — CR DG FINGER THUMB 2+V*L*
1 series · 1 of 1 positions shown · non-contrast
Comparison: None.

CLINICAL DATA: Left thumb laceration last night.

EXAM:
LEFT THUMB 2+V

[PA]
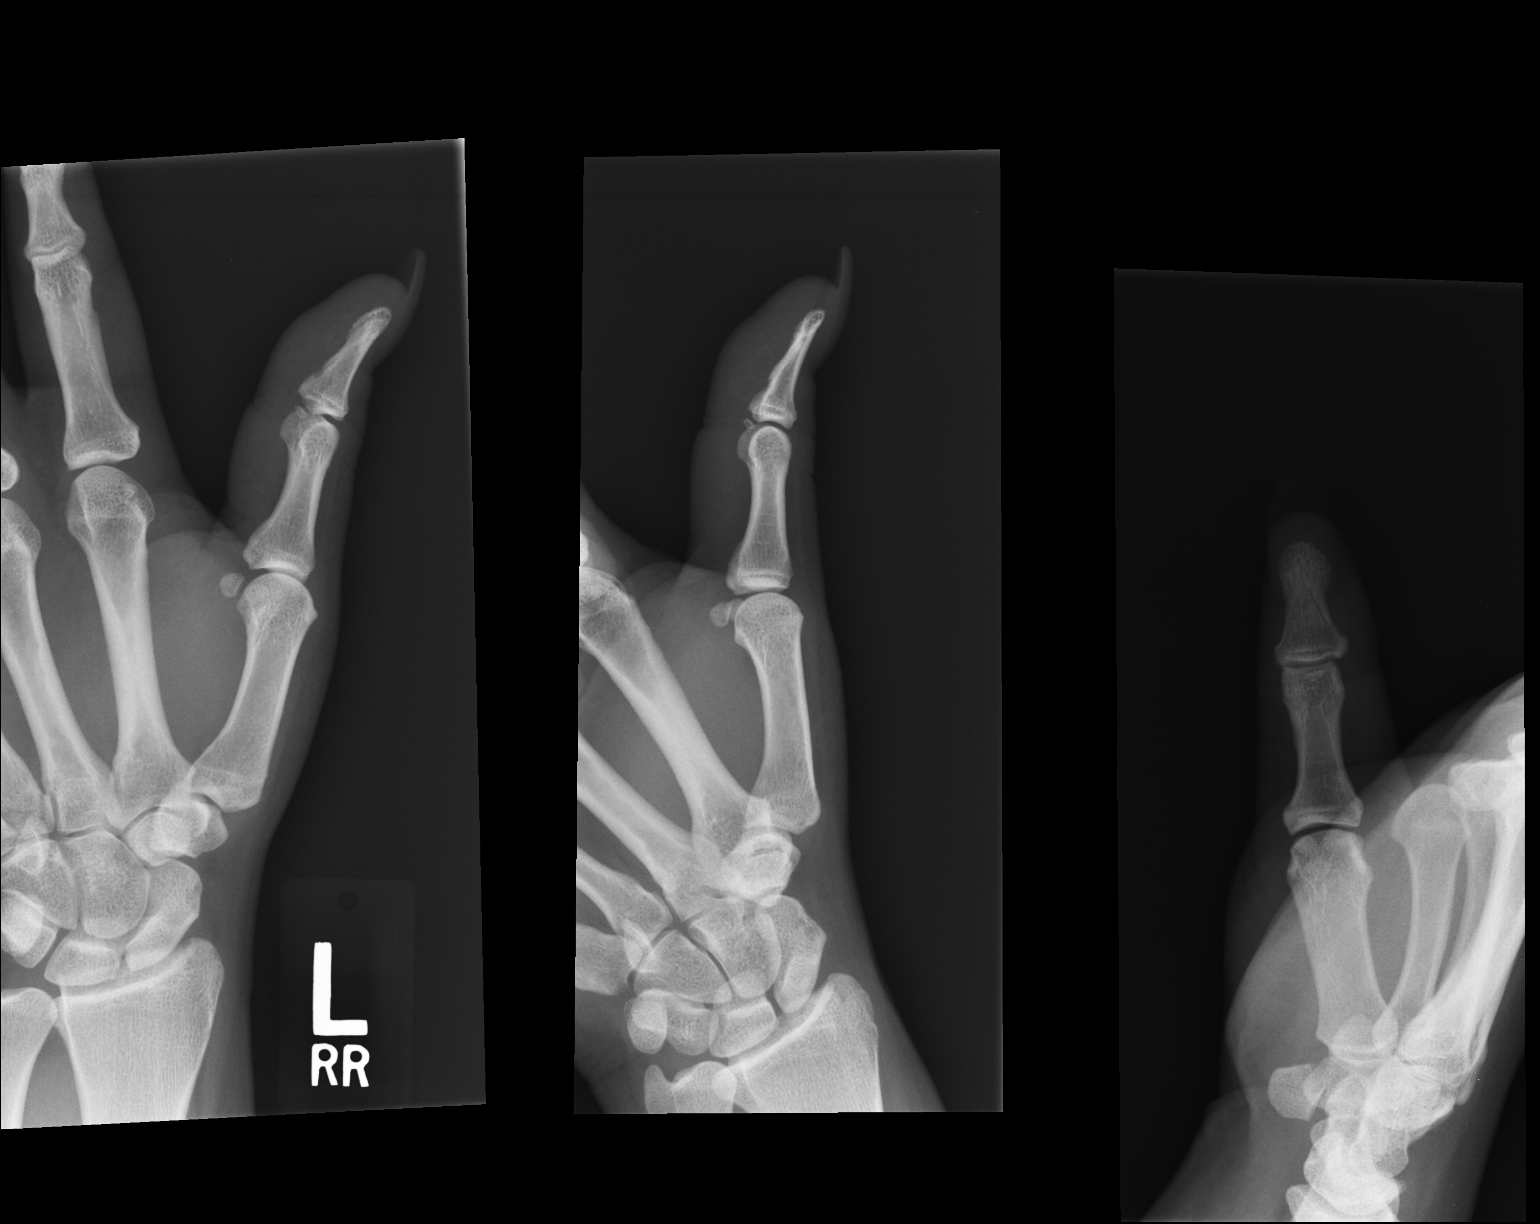

[1 of 1 positions shown; findings below may reference images not displayed]

FINDINGS: There is no evidence of fracture or dislocation. No radiopaque
foreign bodies identified. The soft tissue swelling of the thumb.
IMPRESSION: No acute fracture dislocation.  Soft tissue swelling of the thumb.

## 2017-09-08 IMAGING — CR DG WRIST 2V*L*
2 series · 2 of 2 positions shown · non-contrast
Comparison: None.

CLINICAL DATA: Chronic left wrist pain with occasional tingling. No
trauma.

EXAM:
LEFT WRIST - 2 VIEW

[PA]
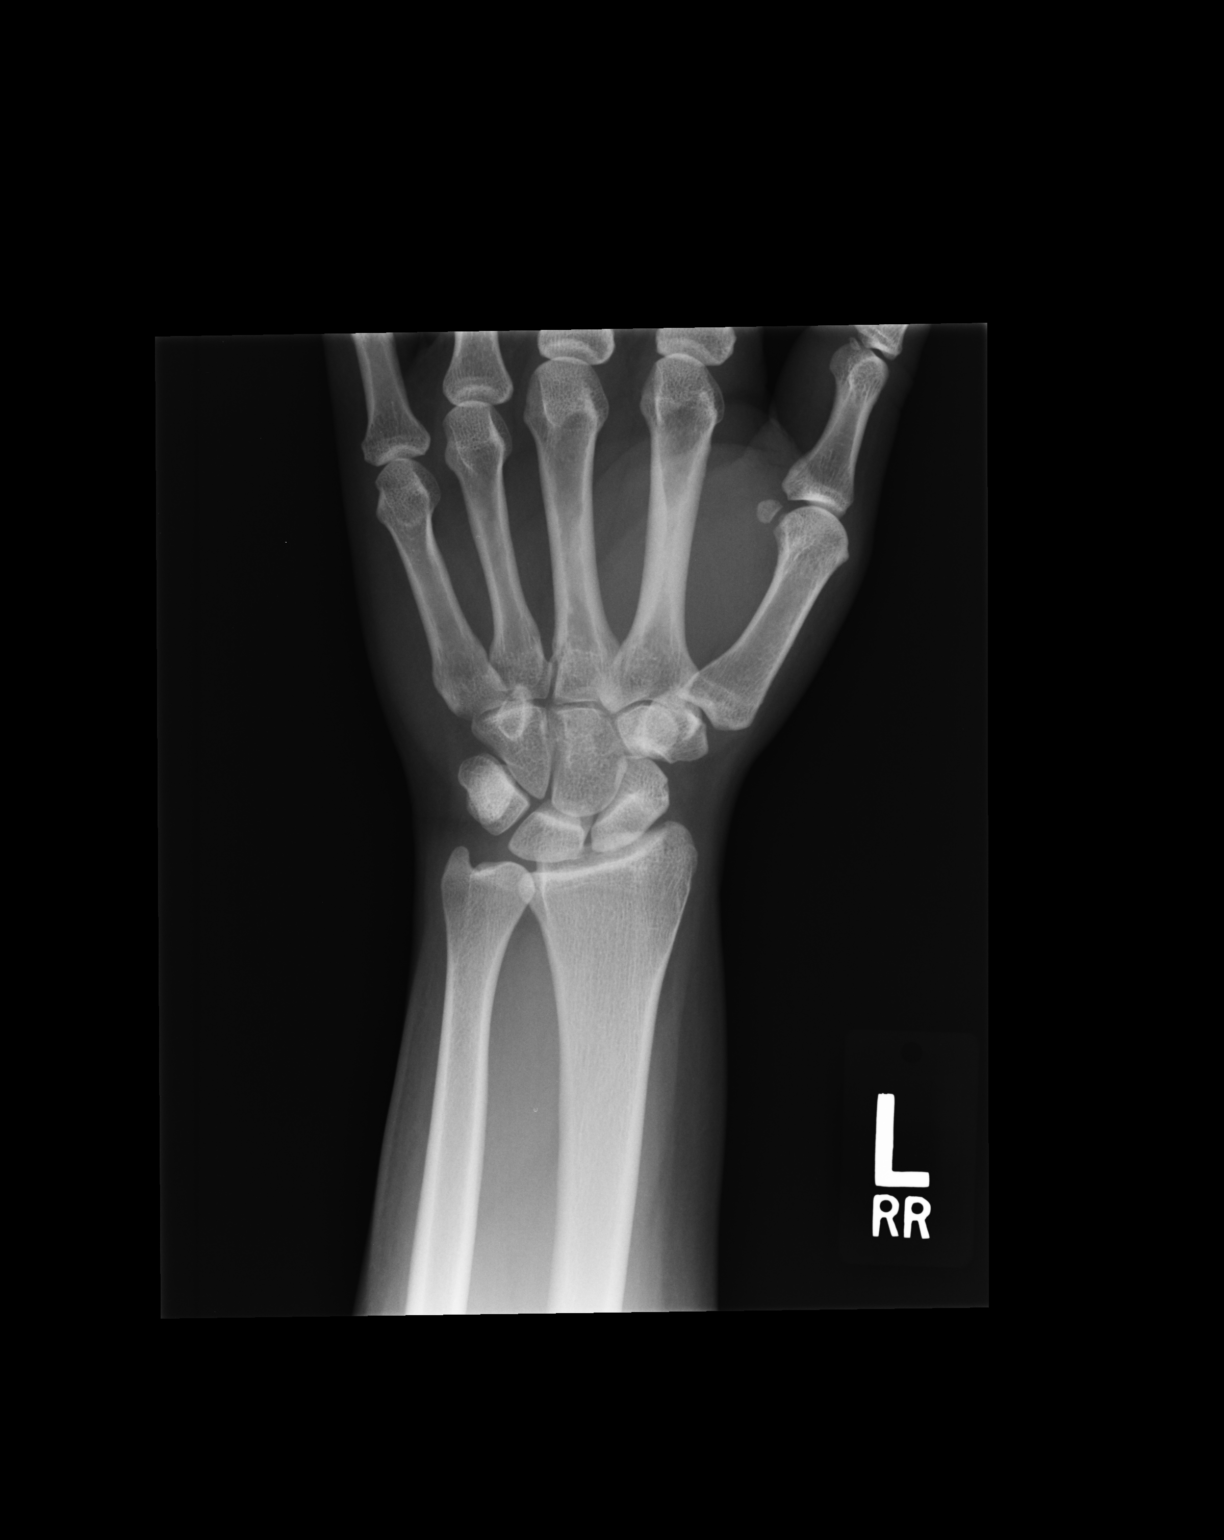

[lateral]
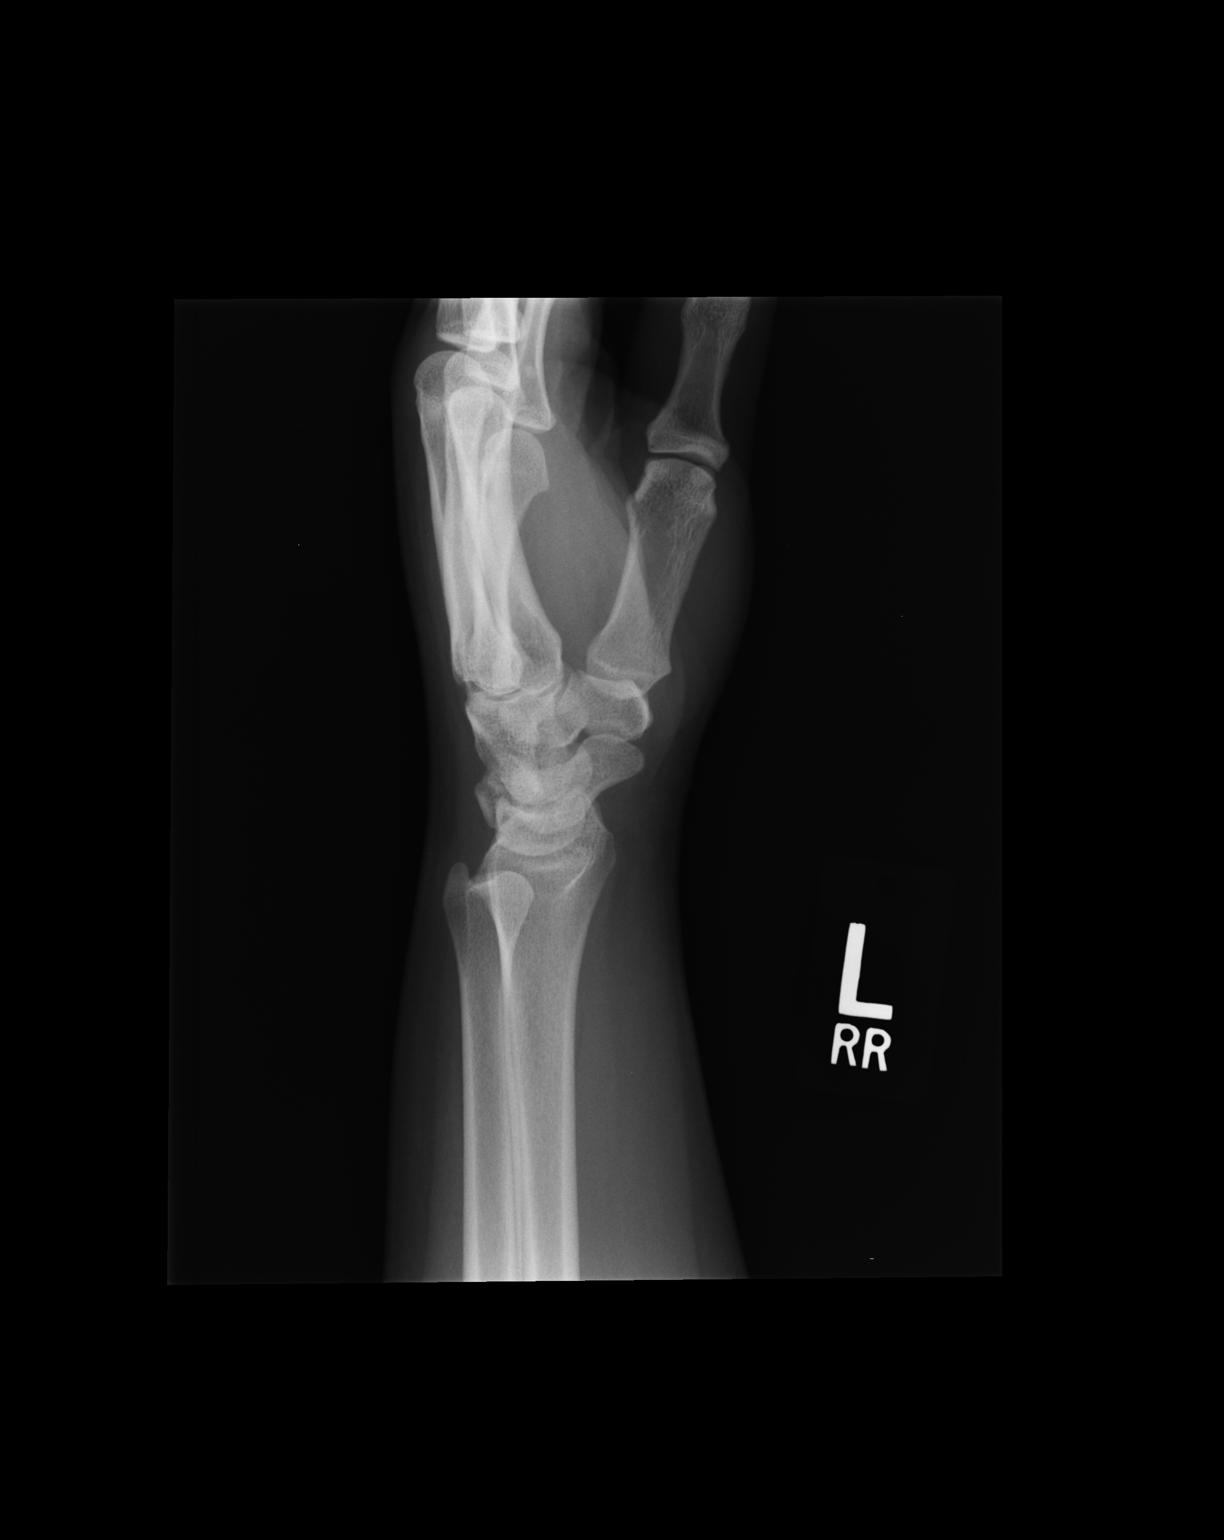

[2 of 2 positions shown; findings below may reference images not displayed]

FINDINGS: There is no evidence of fracture or dislocation. There is no
evidence of arthropathy or other focal bone abnormality. Soft
tissues are unremarkable.
IMPRESSION: Negative.

## 2019-11-15 ENCOUNTER — Encounter (HOSPITAL_BASED_OUTPATIENT_CLINIC_OR_DEPARTMENT_OTHER): Payer: Self-pay

## 2019-11-15 ENCOUNTER — Other Ambulatory Visit: Payer: Self-pay

## 2019-11-15 ENCOUNTER — Emergency Department (HOSPITAL_BASED_OUTPATIENT_CLINIC_OR_DEPARTMENT_OTHER)
Admission: EM | Admit: 2019-11-15 | Discharge: 2019-11-15 | Disposition: A | Discharge status: Home / Self Care | Payer: BC Managed Care – PPO | Attending: Emergency Medicine | Admitting: Emergency Medicine

## 2019-11-15 DIAGNOSIS — R319 Hematuria, unspecified: Secondary | ICD-10-CM | POA: Insufficient documentation

## 2019-11-15 DIAGNOSIS — O26833 Pregnancy related renal disease, third trimester: Secondary | ICD-10-CM | POA: Diagnosis not present

## 2019-11-15 DIAGNOSIS — Z3A26 26 weeks gestation of pregnancy: Secondary | ICD-10-CM | POA: Diagnosis not present

## 2019-11-15 DIAGNOSIS — Z79899 Other long term (current) drug therapy: Secondary | ICD-10-CM | POA: Insufficient documentation

## 2019-11-15 LAB — URINALYSIS, ROUTINE W REFLEX MICROSCOPIC
Bilirubin Urine: NEGATIVE
Glucose, UA: NEGATIVE mg/dL
Hgb urine dipstick: NEGATIVE
Ketones, ur: NEGATIVE mg/dL
Leukocytes,Ua: NEGATIVE
Nitrite: NEGATIVE
Protein, ur: NEGATIVE mg/dL
Specific Gravity, Urine: 1.02 (ref 1.005–1.030)
pH: 6 (ref 5.0–8.0)

## 2019-11-15 LAB — PREGNANCY, URINE: Preg Test, Ur: POSITIVE — AB

## 2019-11-15 NOTE — Progress Notes (Signed)
Spoke with Dr. Hulan Fray. Pt is a G2P0 at 26 4/[redacted] weeks gestation, gets her care at [facility] with Dr. Sharlyn Bologna. Pt  Is presenting with c/o blood in her urine. No vaginal bleeding or leaking of fluid. FHR is a category 1 tracing with no uc's. Pt is OB cleared and ED staff should obtain a U/A.

## 2019-11-15 NOTE — ED Provider Notes (Signed)
[facility] EMERGENCY DEPARTMENT Provider Note   CSN: [id number] Arrival date & time: 11/15/19  1324     History Chief Complaint  Patient presents with  . Vaginal Bleeding    Hannah Graham is a 39 y.o. female.  Patient who is G2P0010, [redacted]w[redacted]d, presents with c/o blood in urine. She had one occurrence of pink urine today. No dysuria. She denies any vaginal bleeding. Patient had a very brief cramp, mid-line to left lower abdomen that lasted for a about a second. No other abdominal pain or back pain. No N/V/D recently. No lightheadedness or syncope. The onset of this condition was acute. The course is constant. Aggravating factors: none. Alleviating factors: none. OB/GYN office [facility].         Past Medical History:  Diagnosis Date  . Allergy     Patient Active Problem List   Diagnosis Date Noted  . Left wrist pain 06/28/2015  . Laceration of thumb 06/28/2015    Past Surgical History:  Procedure Laterality Date  . MYOMECTOMY       OB History    Gravida  1   Para      Term      Preterm      AB      Living        SAB      TAB      Ectopic      Multiple      Live Births              Family History  Problem Relation Age of Onset  . Cancer Father        prostate  . Diabetes Father   . Hyperlipidemia Brother   . Diabetes Paternal Grandmother   . Hearing loss Paternal Grandmother     Social History   Tobacco Use  . Smoking status: Never Smoker  . Smokeless tobacco: Never Used  Substance Use Topics  . Alcohol use: Not Currently    Comment: occ  . Drug use: No    Home Medications Prior to Admission medications   Medication Sig Start Date End Date Taking? Authorizing Provider  famotidine (PEPCID) 20 MG tablet Take 1 tablet (20 mg total) by mouth 2 (two) times daily. 08/01/17   Carlisle Cater, PA-C    Allergies    Patient has no known allergies.  Review of Systems   Review of Systems  Constitutional: Negative for  fever.  HENT: Negative for rhinorrhea and sore throat.   Eyes: Negative for redness.  Respiratory: Negative for cough.   Cardiovascular: Negative for chest pain.  Gastrointestinal: Negative for abdominal pain, diarrhea, nausea and vomiting.  Genitourinary: Positive for pelvic pain (briefx1, no pain at time of exam). Negative for dysuria.  Musculoskeletal: Negative for myalgias.  Skin: Negative for rash.  Neurological: Negative for headaches.  Psychiatric/Behavioral: The patient is nervous/anxious.     Physical Exam Updated Vital Signs BP 117/84 (BP Location: Left Arm)   Pulse (!) 110   Temp 98 F (36.7 C) (Oral)   Resp 20   Ht 5\' 3"  (1.6 m)   Wt (!) 139.3 kg   SpO2 98%   BMI 54.38 kg/m   Physical Exam Vitals and nursing note reviewed.  Constitutional:      Appearance: She is well-developed.  HENT:     Head: Normocephalic and atraumatic.  Eyes:     Conjunctiva/sclera: Conjunctivae normal.  Pulmonary:     Effort: No respiratory distress.  Abdominal:  Comments: Gravid  Musculoskeletal:     Cervical back: Normal range of motion and neck supple.  Skin:    General: Skin is warm and dry.  Neurological:     Mental Status: She is alert.     ED Results / Procedures / Treatments   Labs (all labs ordered are listed, but only abnormal results are displayed) Labs Reviewed  URINALYSIS, ROUTINE W REFLEX MICROSCOPIC - Abnormal; Notable for the following components:      Result Value   APPearance CLOUDY (*)    All other components within normal limits  PREGNANCY, URINE - Abnormal; Notable for the following components:   Preg Test, Ur POSITIVE (*)    All other components within normal limits    EKG None  Radiology No results found.  Procedures Procedures (including critical care time)  Medications Ordered in ED Medications - No data to display  ED Course  I have reviewed the triage vital signs and the nursing notes.  Pertinent labs & imaging results that  were available during my care of the patient were reviewed by me and considered in my medical decision making (see chart for details).  Patient seen and examined. UA is clear here. Pt currently on monitoring. Awaiting reccs.   Vital signs reviewed and are as follows: BP 117/84 (BP Location: Left Arm)   Pulse (!) 110   Temp 98 F (36.7 C) (Oral)   Resp 20   Ht 5\' 3"  (1.6 m)   Wt (!) 139.3 kg   SpO2 98%   BMI 54.38 kg/m   3:33 PM Fetal monitoring is normal -- please see OB RN note in Epic for details. Pt cleared from OB standpoint.   Pt updated.  We discussed need to return or call her OB group if she develops any persistent or recurrent abdominal or pelvic pain, back pain, vaginal bleeding, discharge.  Courtesy call placed to patient's OB group. They will be happy to follow-up with patient next week.    MDM Rules/Calculators/A&P                      Patient with question of visualized blood and very brief episode of lower abdominal pain, lasting no more than a few seconds, at 26 weeks 4 days of pregnancy.  UA here is clear.  Fetal heart monitoring undertaken and is reassuring.  Patient has appropriate OB/GYN follow-up.  Plan for discharge.  Return instructions discussed as above.    Final Clinical Impression(s) / ED Diagnoses Final diagnoses:  Hematuria, unspecified type  [redacted] weeks gestation of pregnancy    Rx / DC Orders ED Discharge Orders    None       Carlisle Cater, Hershal Coria 11/15/19 [place], MD 11/16/19 [id number]

## 2019-11-15 NOTE — Progress Notes (Signed)
Spoke with [facility] ED RN. Pt is OB cleared. Says they have obtained a urine and it is clear.

## 2019-11-15 NOTE — ED Notes (Signed)
Pt verbalized understanding d/c instructions. 

## 2019-11-15 NOTE — ED Notes (Signed)
Spoke with Hannah Graham at [facility]. Report pt endorses 2nd pregnancy, with first preg ending in spontaneous abortion. G2, P0. Last period in July, pt reports uncertainty of exact date of last menstrual cycle. Due date 02/17/2020. Pt denies any difficulties with current pregnancy. Pt reports scant amt of blood in toilet after urinating, no blood when wiping. Pt denies abdominal pain or cramping, denies bleeding at time of arrival.  Endorses being [redacted] weeks pregnant.

## 2019-11-15 NOTE — ED Notes (Signed)
Pt given snack. EDP at bedside.

## 2019-11-15 NOTE — ED Notes (Signed)
ED Provider at bedside. 

## 2019-11-15 NOTE — Discharge Instructions (Signed)
Your urine test today was clear without signs of bleeding and your fetal monitoring was normal.  Please follow-up with your OB/GYN next week.  Please go to [facility] if you develop any vaginal bleeding, additional blood noted in the urine, constant or recurrent pelvic or abdominal pain, back pain.

## 2019-11-15 NOTE — ED Notes (Signed)
Pt on fetal monitor

## 2019-11-15 NOTE — ED Triage Notes (Signed)
Pt noted some blood in her urine once. No blood after wiping. Pt also reported a brief sharp pelvic pain. Pt is [redacted] weeks pregnant a pt of Dr. Sharlyn Bologna from [facility].

## 2019-11-15 NOTE — ED Notes (Signed)
Per Stanton Kidney with [facility], OB pt stable, tracing looks WNL. [place] notified

## 2019-11-15 NOTE — Progress Notes (Signed)
Received call from [facility]. Pt is a G2P0 at 26 4/[redacted] weeks gestation presenting with complaints of seeing blood in her urine today. Pt denies seeing any blood after she wiped. Denies leaking of fluid and says she has been feeling her baby move. She gets her care at [facility] with Dr. Sharlyn Bologna. Denies any problems with this pregnancy.

## 2022-09-02 ENCOUNTER — Ambulatory Visit
Admission: RE | Admit: 2022-09-02 | Discharge: 2022-09-02 | Disposition: A | Discharge status: Home / Self Care | Payer: BC Managed Care – PPO | Source: Ambulatory Visit

## 2022-09-02 VITALS — BP 107/72 | HR 80 | Temp 98.0°F | Resp 16

## 2022-09-02 DIAGNOSIS — L0231 Cutaneous abscess of buttock: Secondary | ICD-10-CM | POA: Diagnosis not present

## 2022-09-02 MED ORDER — SULFAMETHOXAZOLE-TRIMETHOPRIM 800-160 MG PO TABS: 2.0000 | ORAL_TABLET | Freq: Two times a day (BID) | ORAL | 0 refills | Status: AC

## 2022-09-02 NOTE — ED Provider Notes (Signed)
[facility]    CSN: [id number] Arrival date & time: 09/02/22  5732    HISTORY   Chief Complaint  Patient presents with   Insect Bite    I felt as if I was bitten on the right lower backside near my intergluteal cleft I  scratch it and now has gone away it's been 2-3 days. - Entered by patient   HPI CAROLYNNE SCHUCHARD is a pleasant, 41 y.o. female who presents to urgent care today. The patient states she felt like she obtained a insect bite to the right lower buttocks. The patient states she felt scabbing to the area.  Patient states this began 2 to 3 days ago when she has been applying alcohol to the area.  Patient states she is concerned it may be infected.    Past Medical History:  Diagnosis Date   Allergy    Patient Active Problem List   Diagnosis Date Noted   Left wrist pain 06/28/2015   Laceration of thumb 06/28/2015   Past Surgical History:  Procedure Laterality Date   MYOMECTOMY     OB History     Gravida  1   Para      Term      Preterm      AB      Living         SAB      IAB      Ectopic      Multiple      Live Births             Home Medications    Prior to Admission medications   Medication Sig Start Date End Date Taking? Authorizing Provider  trospium (SANCTURA) 20 MG tablet Take by mouth. 07/04/22  Yes [provider]  famotidine (PEPCID) 20 MG tablet Take 1 tablet (20 mg total) by mouth 2 (two) times daily. 08/01/17   Carlisle Cater, PA-C    Family History Family History  Problem Relation Age of Onset   Cancer Father        prostate   Diabetes Father    Hyperlipidemia Brother    Diabetes Paternal Grandmother    Hearing loss Paternal Grandmother    Social History Social History   Tobacco Use   Smoking status: Never   Smokeless tobacco: Never  Vaping Use   Vaping Use: Never used  Substance Use Topics   Alcohol use: Not Currently    Comment: occ   Drug use: No   Allergies   Patient has no  known allergies.  Review of Systems Review of Systems Pertinent findings revealed after performing a 14 point review of systems has been noted in the history of present illness.  Physical Exam Triage Vital Signs ED Triage Vitals  Enc Vitals Group     BP 08/12/21 0827 (!) 147/82     Pulse Rate 08/12/21 0827 72     Resp 08/12/21 0827 18     Temp 08/12/21 0827 98.3 F (36.8 C)     Temp Source 08/12/21 0827 Oral     SpO2 08/12/21 0827 98 %     Weight --      Height --      Head Circumference --      Peak Flow --      Pain Score 08/12/21 0826 5     Pain Loc --      Pain Edu? --      Excl. in [place]? --  No data found.  Updated Vital Signs BP 107/72 (BP Location: Right Arm)   Pulse 80   Temp 98 F (36.7 C) (Oral)   Resp 16   SpO2 95%   Breastfeeding No   Physical Exam Vitals and nursing note reviewed.  Constitutional:      General: She is not in acute distress.    Appearance: Normal appearance.  HENT:     Head: Normocephalic and atraumatic.  Eyes:     Pupils: Pupils are equal, round, and reactive to light.  Cardiovascular:     Rate and Rhythm: Normal rate and regular rhythm.  Pulmonary:     Effort: Pulmonary effort is normal.     Breath sounds: Normal breath sounds.  Musculoskeletal:        General: Normal range of motion.     Cervical back: Normal range of motion and neck supple.  Skin:    General: Skin is warm and dry.     Findings: Lesion (Abscess at the superior aspect of right side of the intergluteal cleft with mild erythema and induration, center is nonfluctuant, no punctate appreciated) present.  Neurological:     General: No focal deficit present.     Mental Status: She is alert and oriented to person, place, and time. Mental status is at baseline.  Psychiatric:        Mood and Affect: Mood normal.        Behavior: Behavior normal.        Thought Content: Thought content normal.        Judgment: Judgment normal.     Visual Acuity Right Eye  Distance:   Left Eye Distance:   Bilateral Distance:    Right Eye Near:   Left Eye Near:    Bilateral Near:     UC Couse / Diagnostics / Procedures:     Radiology No results found.  Procedures Procedures (including critical care time) EKG  Pending results:  Labs Reviewed - No data to display  Medications Ordered in UC: Medications - No data to display  UC Diagnoses / Final Clinical Impressions(s)   I have reviewed the triage vital signs and the nursing notes.  Pertinent labs & imaging results that were available during my care of the patient were reviewed by me and considered in my medical decision making (see chart for details).    Final diagnoses:  Abscess of right buttock   Patient provided with a prescription for Bactrim 2 tabs twice daily for 5 days.  Patient advised to follow-up if not improved after treatment.  ED Prescriptions     Medication Sig Dispense Auth. Provider   sulfamethoxazole-trimethoprim (BACTRIM DS) 800-160 MG tablet Take 2 tablets by mouth 2 (two) times daily for 5 days. 20 tablet Lynden Oxford Scales, PA-C      PDMP not reviewed this encounter.  Pending results:  Labs Reviewed - No data to display  Discharge Instructions:   Discharge Instructions      Please begin Bactrim 2 tablets twice daily for 5 days.  You are welcome to apply Vaseline, vitamin E or topical antibiotic ointment to the lesion.  Thank you for visiting urgent care today.      Disposition Upon Discharge:  Condition: stable for discharge home  Patient presented with an acute illness with associated systemic symptoms and significant discomfort requiring urgent management. In my opinion, this is a condition that a prudent lay person (someone who possesses an average knowledge of health and medicine) may potentially  expect to result in complications if not addressed urgently such as respiratory distress, impairment of bodily function or dysfunction of bodily organs.    Routine symptom specific, illness specific and/or disease specific instructions were discussed with the patient and/or caregiver at length.   As such, the patient has been evaluated and assessed, work-up was performed and treatment was provided in alignment with urgent care protocols and evidence based medicine.  Patient/parent/caregiver has been advised that the patient may require follow up for further testing and treatment if the symptoms continue in spite of treatment, as clinically indicated and appropriate.  Patient/parent/caregiver has been advised to return to the [facility] or PCP if no better; to PCP or the Emergency Department if new signs and symptoms develop, or if the current signs or symptoms continue to change or worsen for further workup, evaluation and treatment as clinically indicated and appropriate  The patient will follow up with their current PCP if and as advised. If the patient does not currently have a PCP we will assist them in obtaining one.   The patient may need specialty follow up if the symptoms continue, in spite of conservative treatment and management, for further workup, evaluation, consultation and treatment as clinically indicated and appropriate.   Patient/parent/caregiver verbalized understanding and agreement of plan as discussed.  All questions were addressed during visit.  Please see discharge instructions below for further details of plan.  This office note has been dictated using Museum/gallery curator.  Unfortunately, this method of dictation can sometimes lead to typographical or grammatical errors.  I apologize for your inconvenience in advance if this occurs.  Please do not hesitate to reach out to me if clarification is needed.      Lynden Oxford Scales, PA-C 09/02/22 1007

## 2022-09-02 NOTE — Discharge Instructions (Addendum)
Please begin Bactrim 2 tablets twice daily for 5 days.  You are welcome to apply Vaseline, vitamin E or topical antibiotic ointment to the lesion.  Thank you for visiting urgent care today.

## 2022-09-02 NOTE — ED Triage Notes (Signed)
The patient states she felt like she obtained a insect bite to the right lower buttocks. The patient states she felt scabbing to the area.  Started: 2-3 days ago.   Home interventions: alcohol rub

## 2023-03-04 ENCOUNTER — Ambulatory Visit
Admission: EM | Admit: 2023-03-04 | Discharge: 2023-03-04 | Disposition: A | Discharge status: Home / Self Care | Payer: BC Managed Care – PPO | Attending: Nurse Practitioner | Admitting: Nurse Practitioner

## 2023-03-04 DIAGNOSIS — R198 Other specified symptoms and signs involving the digestive system and abdomen: Secondary | ICD-10-CM

## 2023-03-04 MED ORDER — MUPIROCIN CALCIUM 2 % EX CREA
1.0000 | TOPICAL_CREAM | Freq: Three times a day (TID) | CUTANEOUS | 0 refills | Status: AC
Start: 2023-03-04 — End: 2023-03-11

## 2023-03-04 NOTE — ED Triage Notes (Signed)
Pt states that she just noticed a bump in her belly button. Boiled it out with peroxide last night and noticed red bubbles.

## 2023-03-04 NOTE — ED Provider Notes (Signed)
[facility]    CSN: [id number] Arrival date & time: 03/04/23  1041      History   Chief Complaint Chief Complaint  Patient presents with   Abscess    HPI Hannah Graham is a 42 y.o. female presents for evaluation of possible umbilical infection.  Patient reports yesterday she noticed a bump on her bellybutton.  She attempted to clean out with with rubbing alcohol and peroxide.  After that she noticed some blood to the area and came in for evaluation.  Does notice some pain with palpation to the superior aspect of the navel.  No fevers or chills.  No active drainage.  She does have a history of abscess in the past.  No history of MRSA.  No OTC medications have been used since onset.  No other concerns at this time.   Abscess   Past Medical History:  Diagnosis Date   Allergy     Patient Active Problem List   Diagnosis Date Noted   Left wrist pain 06/28/2015   Laceration of thumb 06/28/2015    Past Surgical History:  Procedure Laterality Date   MYOMECTOMY      OB History     Gravida  1   Para      Term      Preterm      AB      Living         SAB      IAB      Ectopic      Multiple      Live Births               Home Medications    Prior to Admission medications   Medication Sig Start Date End Date Taking? Authorizing Provider  famotidine (PEPCID) 20 MG tablet Take 1 tablet (20 mg total) by mouth 2 (two) times daily. 08/01/17  Yes Renne Crigler, PA-C  mupirocin cream (BACTROBAN) 2 % Apply 1 Application topically 3 (three) times daily for 7 days. 03/04/23 03/11/23 Yes Radford Pax, NP  trospium (SANCTURA) 20 MG tablet Take by mouth. 07/04/22  Yes [provider]    Family History Family History  Problem Relation Age of Onset   Cancer Father        prostate   Diabetes Father    Hyperlipidemia Brother    Diabetes Paternal Grandmother    Hearing loss Paternal Grandmother     Social History Social History    Tobacco Use   Smoking status: Never   Smokeless tobacco: Never  Vaping Use   Vaping Use: Never used  Substance Use Topics   Alcohol use: Not Currently    Comment: occ   Drug use: No     Allergies   Patient has no known allergies.   Review of Systems Review of Systems  Skin:        Possible naval infection      Physical Exam Triage Vital Signs ED Triage Vitals  Enc Vitals Group     BP 03/04/23 1050 128/82     Pulse Rate 03/04/23 1050 98     Resp 03/04/23 1050 18     Temp 03/04/23 1050 98.2 F (36.8 C)     Temp Source 03/04/23 1050 Oral     SpO2 03/04/23 1050 95 %     Weight 03/04/23 1049 264 lb (119.7 kg)     Height --      Head Circumference --  Peak Flow --      Pain Score 03/04/23 1049 5     Pain Loc --      Pain Edu? --      Excl. in [place]? --    No data found.  Updated Vital Signs BP 128/82 (BP Location: Right Arm)   Pulse 98   Temp 98.2 F (36.8 C) (Oral)   Resp 18   Wt 264 lb (119.7 kg)   SpO2 95%   BMI 46.77 kg/m   Visual Acuity Right Eye Distance:   Left Eye Distance:   Bilateral Distance:    Right Eye Near:   Left Eye Near:    Bilateral Near:     Physical Exam Vitals and nursing note reviewed.  Constitutional:      General: She is not in acute distress.    Appearance: Normal appearance. She is not ill-appearing.  HENT:     Head: Normocephalic and atraumatic.  Eyes:     Pupils: Pupils are equal, round, and reactive to light.  Cardiovascular:     Rate and Rhythm: Normal rate.  Pulmonary:     Effort: Pulmonary effort is normal.  Skin:    General: Skin is warm and dry.     Comments: There is no swelling, induration, fluctuance, erythema of the navel or area surrounding the navel.  There is mild erythema on the right inner aspect of the navel.  No active drainage.  Mildly tender to palpation.  Neurological:     General: No focal deficit present.     Mental Status: She is alert and oriented to person, place, and time.   Psychiatric:        Mood and Affect: Mood normal.        Behavior: Behavior normal.      UC Treatments / Results  Labs (all labs ordered are listed, but only abnormal results are displayed) Labs Reviewed - No data to display  EKG   Radiology No results found.  Procedures Procedures (including critical care time)  Medications Ordered in UC Medications - No data to display  Initial Impression / Assessment and Plan / UC Course  I have reviewed the triage vital signs and the nursing notes.  Pertinent labs & imaging results that were available during my care of the patient were reviewed by me and considered in my medical decision making (see chart for details).     Discussed with patient likely irritation of the navel.  Advised to stop using alcohol or peroxide. Will do trial of mupirocin 3 times daily for a week Advised to keep nail clean with soap and water only Follow-up with PCP if symptoms do not improve ER precautions reviewed and patient verbalized understanding Final Clinical Impressions(s) / UC Diagnoses   Final diagnoses:  Umbilical discharge     Discharge Instructions      Start mupirocin topical antibiotic cream to your navel area 3 times a day Keep the area clean with soap and water Follow-up with your PCP if your symptoms do not improve Please go to the ER for any worsening symptoms   ED Prescriptions     Medication Sig Dispense Auth. Provider   mupirocin cream (BACTROBAN) 2 % Apply 1 Application topically 3 (three) times daily for 7 days. 15 g Radford Pax, NP      PDMP not reviewed this encounter.   Radford Pax, NP 03/04/23 1130

## 2023-03-04 NOTE — Discharge Instructions (Signed)
Start mupirocin topical antibiotic cream to your navel area 3 times a day Keep the area clean with soap and water Follow-up with your PCP if your symptoms do not improve Please go to the ER for any worsening symptoms

## 2023-03-05 ENCOUNTER — Telehealth: Payer: Self-pay
